# Patient Record
Sex: Female | Born: 1965 | Race: Black or African American | Hispanic: No | Marital: Single | Smoking: Never smoker
Health system: Southern US, Community
[De-identification: ages and names within clinical notes are randomized; demographics above are authoritative.]

## PROBLEM LIST (undated history)

## (undated) DIAGNOSIS — G43909 Migraine, unspecified, not intractable, without status migrainosus: Secondary | ICD-10-CM

## (undated) DIAGNOSIS — D649 Anemia, unspecified: Secondary | ICD-10-CM

## (undated) HISTORY — DX: Anemia, unspecified: D64.9

## (undated) HISTORY — DX: Migraine, unspecified, not intractable, without status migrainosus: G43.909

---

## 1997-03-27 DIAGNOSIS — G43909 Migraine, unspecified, not intractable, without status migrainosus: Secondary | ICD-10-CM

## 1997-03-27 HISTORY — DX: Migraine, unspecified, not intractable, without status migrainosus: G43.909

## 1998-07-01 ENCOUNTER — Ambulatory Visit (HOSPITAL_COMMUNITY): Admission: RE | Admit: 1998-07-01 | Discharge: 1998-07-01 | Payer: Self-pay | Admitting: Family Medicine

## 2002-02-05 ENCOUNTER — Other Ambulatory Visit: Admission: RE | Admit: 2002-02-05 | Discharge: 2002-02-05 | Payer: Self-pay | Admitting: Family Medicine

## 2002-02-05 ENCOUNTER — Encounter: Payer: Self-pay | Admitting: Family Medicine

## 2002-02-05 ENCOUNTER — Encounter: Admission: RE | Admit: 2002-02-05 | Discharge: 2002-02-05 | Payer: Self-pay | Admitting: Family Medicine

## 2002-02-13 ENCOUNTER — Ambulatory Visit (HOSPITAL_COMMUNITY): Admission: RE | Admit: 2002-02-13 | Discharge: 2002-02-13 | Payer: Self-pay | Admitting: Family Medicine

## 2002-03-25 ENCOUNTER — Ambulatory Visit (HOSPITAL_COMMUNITY): Admission: RE | Admit: 2002-03-25 | Discharge: 2002-03-25 | Payer: Self-pay | Admitting: Gastroenterology

## 2002-04-28 ENCOUNTER — Encounter: Payer: Self-pay | Admitting: Gastroenterology

## 2002-04-28 ENCOUNTER — Encounter: Admission: RE | Admit: 2002-04-28 | Discharge: 2002-04-28 | Payer: Self-pay | Admitting: Gastroenterology

## 2002-09-22 ENCOUNTER — Encounter: Admission: RE | Admit: 2002-09-22 | Discharge: 2002-09-22 | Payer: Self-pay | Admitting: Family Medicine

## 2002-09-22 ENCOUNTER — Encounter: Payer: Self-pay | Admitting: Family Medicine

## 2003-02-05 ENCOUNTER — Ambulatory Visit (HOSPITAL_COMMUNITY): Admission: RE | Admit: 2003-02-05 | Discharge: 2003-02-05 | Payer: Self-pay | Admitting: Obstetrics & Gynecology

## 2003-04-15 ENCOUNTER — Ambulatory Visit: Admission: RE | Admit: 2003-04-15 | Discharge: 2003-04-15 | Payer: Self-pay | Admitting: Gynecologic Oncology

## 2003-06-09 ENCOUNTER — Other Ambulatory Visit: Admission: RE | Admit: 2003-06-09 | Discharge: 2003-06-09 | Payer: Self-pay | Admitting: Family Medicine

## 2003-07-27 ENCOUNTER — Ambulatory Visit (HOSPITAL_COMMUNITY): Admission: RE | Admit: 2003-07-27 | Discharge: 2003-07-27 | Payer: Self-pay | Admitting: *Deleted

## 2003-11-16 ENCOUNTER — Encounter (INDEPENDENT_AMBULATORY_CARE_PROVIDER_SITE_OTHER): Payer: Self-pay | Admitting: Specialist

## 2003-11-16 ENCOUNTER — Inpatient Hospital Stay (HOSPITAL_COMMUNITY): Admission: RE | Admit: 2003-11-16 | Discharge: 2003-11-19 | Payer: Self-pay | Admitting: Obstetrics & Gynecology

## 2006-03-26 ENCOUNTER — Emergency Department (HOSPITAL_COMMUNITY): Admission: EM | Admit: 2006-03-26 | Discharge: 2006-03-26 | Payer: Self-pay | Admitting: Emergency Medicine

## 2006-04-03 ENCOUNTER — Encounter: Admission: RE | Admit: 2006-04-03 | Discharge: 2006-05-03 | Payer: Self-pay | Admitting: Family Medicine

## 2006-07-31 ENCOUNTER — Other Ambulatory Visit: Admission: RE | Admit: 2006-07-31 | Discharge: 2006-07-31 | Payer: Self-pay | Admitting: Family Medicine

## 2006-09-30 ENCOUNTER — Emergency Department (HOSPITAL_COMMUNITY): Admission: EM | Admit: 2006-09-30 | Discharge: 2006-09-30 | Payer: Self-pay | Admitting: Emergency Medicine

## 2006-10-25 ENCOUNTER — Encounter: Admission: RE | Admit: 2006-10-25 | Discharge: 2006-10-25 | Payer: Self-pay | Admitting: Family Medicine

## 2006-12-13 ENCOUNTER — Ambulatory Visit (HOSPITAL_COMMUNITY): Admission: RE | Admit: 2006-12-13 | Discharge: 2006-12-13 | Payer: Self-pay | Admitting: Obstetrics & Gynecology

## 2007-06-24 ENCOUNTER — Encounter: Admission: RE | Admit: 2007-06-24 | Discharge: 2007-06-24 | Payer: Self-pay | Admitting: Orthopedic Surgery

## 2007-12-19 ENCOUNTER — Ambulatory Visit (HOSPITAL_COMMUNITY): Admission: RE | Admit: 2007-12-19 | Discharge: 2007-12-19 | Payer: Self-pay | Admitting: Obstetrics & Gynecology

## 2008-06-27 ENCOUNTER — Emergency Department (HOSPITAL_COMMUNITY): Admission: EM | Admit: 2008-06-27 | Discharge: 2008-06-27 | Payer: Self-pay | Admitting: Family Medicine

## 2009-04-20 ENCOUNTER — Other Ambulatory Visit: Admission: RE | Admit: 2009-04-20 | Discharge: 2009-04-20 | Payer: Self-pay | Admitting: Family Medicine

## 2010-03-27 HISTORY — PX: OTHER SURGICAL HISTORY: SHX169

## 2010-04-17 ENCOUNTER — Encounter: Payer: Self-pay | Admitting: Family Medicine

## 2010-08-12 NOTE — H&P (Signed)
NAME:  Brianna Bradshaw, Brianna Bradshaw                          ACCOUNT NO.:  [id number]   MEDICAL RECORD NO.:  [id number]                   PATIENT TYPE:  AMB   LOCATION:  SDC                                  [facility]   PHYSICIAN:  Roseanna Rainbow, M.D.         DATE OF BIRTH:  05/07/1965   DATE OF ADMISSION:  DATE OF DISCHARGE:                                HISTORY & PHYSICAL   CHIEF COMPLAINT:  The patient is a 45 year old African American female with  a right-sided pelvic mass and uterine fibroids who presents for exploratory  laparotomy with right ovarian cystectomy and multiple abdominal  myomectomies.   HISTORY OF PRESENT ILLNESS:  The patient has a history of menorrhagia, mild  dysmenorrhea.  She also has history of subacute right lower quadrant  discomfort.  Work-up to date has included multiple radiologic studies.  The  radiologic studies have been consistent with a right-sided complex pelvic  mass approximately 4 cm in diameter.  A CA125 was 30.  A recent hemoglobin  was 9.7.  The patient had a second opinion given by Dr. Kyla Balzarine who agreed  with surgical intervention.   ALLERGIES:  No known drug allergies.   MEDICATIONS:  Chromagen Forte.   PAST OB/GYN HISTORY:  Please see above.   PAST MEDICAL HISTORY:  1. Anemia.  2. Headaches.   SOCIAL HISTORY:  She is single.  She denies any tobacco, ethanol or  substance abuse.   FAMILY HISTORY:  Remarkable for colon cancer, diabetes, hypertension and  lung cancer.   PHYSICAL EXAMINATION:  GENERAL APPEARANCE:  A well-developed, well-nourished  African American female in no apparent distress.  VITAL SIGNS:  Blood pressure 113/66, pulse 76, temperature 97.4, weight 140  pounds.  HEENT:  Normocephalic and atraumatic.  NECK:  Supple.  LUNGS:  Clear to auscultation bilaterally.  CARDIOVASCULAR:  Regular rate and rhythm.  ABDOMEN:  Soft, nontender, no organomegaly.  PELVIC:  BUS normal.  On speculum examination the vagina is clean.   On  bimanual examination, the uterus is upper limits of normal size, nontender.  The adnexa are nontender bilaterally.   ASSESSMENT:  1. Right-sided complex adnexal mass.  2. Myomatous uterus with secondary menorrhagia.   PLAN:  Exploratory laparotomy with possible ovarian cystectomy, possible  right salpingo-oophorectomy with multiple abdominal myomectomies.  The  risks, benefits, and alternative forms of management were reviewed with the  patient and informed consent had been obtained.                                               Roseanna Rainbow, M.D.    Judee Clara  D:  11/12/2003  T:  11/12/2003  Job:  [id number]

## 2010-08-12 NOTE — Op Note (Signed)
NAME:  Brianna Bradshaw, Brianna Bradshaw                          ACCOUNT NO.:  [id number]   MEDICAL RECORD NO.:  [id number]                   PATIENT TYPE:  INP   LOCATION:  9399                                 [facility]   PHYSICIAN:  Roseanna Rainbow, M.D.         DATE OF BIRTH:  Feb 27, 1966   DATE OF PROCEDURE:  11/16/2003  DATE OF DISCHARGE:                                 OPERATIVE REPORT   PREOPERATIVE DIAGNOSIS:  Right-sided adnexal mass, uterine fibroid, pelvic  pain.   POSTOPERATIVE DIAGNOSIS:  Right-sided adnexal mass, uterine fibroid, pelvic  pain.  Endometriosis, adhesions.   OPERATION PERFORMED:  Exploratory laparotomy with lysis of adhesions and  multiple abdominal myomectomies.   SURGEON:  1. Roseanna Rainbow, M.D.  2. Charles A. Clearance Coots, M.D.   ANESTHESIA:  General endotracheal.   ESTIMATED BLOOD LOSS:  150 mL.   COMPLICATIONS:  None.   Urine output and IV fluids as per anesthesiology.   DESCRIPTION OF PROCEDURE:  The patient was taken to the operating room.  She  was placed in the dorsal lithotomy position and prepped and draped in the  usual sterile fashion.  A Pfannenstiel skin incision was then made with a  scalpel and carried down to the underlying fascia with a Bovie.  The fascia  was nicked in the midline.  The fascial incision was then extended  bilaterally with curved Mayo scissors.  The superior aspect of the fascial  incision was then tented up with straight Kocher clamps and the underlying  rectus muscle was dissected off.  The inferior aspect of the fascial  incision was then manipulated in a similar fashion.  The rectus muscles were  separated in the midline.  The parietal peritoneum was tented up and entered  sharply with a scalpel.  This incision was then extended superiorly with  good visualization of the bladder.  An O'Sullivan-O'Connor retractor was  then placed into the incision.  The bowel was packed away with moist  laparotomy pack. At this  point a survey of the pelvic viscera included dense  adhesions involving the sigmoid colon to the posterior wall of the uterus.  A chocolate cyst in the broad ligament on the right side.  There were some  endometriotic implants on the antimesenteric portions of the tubes  bilaterally approximately 1 cm in diameter.  There were also small  endometriotic implants involving the broad ligament on the left pelvic side  wall.  The anterior cul-de-sac and appendix were noted to be free of  disease.  There were also several myomas.  One was fundal and subserosal in  the midline approximately 2 to 3 cm in diameter.  There were few bilateral  fundal myomas.  There were also several small submucous myomas.  The  endometrial cavity was entered during the course of the myomectomy.  At this  point the adhesions noted between the sigmoid and the posterior aspect of  the uterus  were sharply divided with Metzenbaum scissors.  The endometriotic  implants on the tubes were excised.  The myomas were excised using the  following technique.  The serosa over the overlying myoma was infiltrated  with a dilute Pitressin solution.  The myometrium was incised using cautery  down to the myoma. The myoma was then grasped with a towel clip and then  excised with cautery.  The base of the myoma was secured with [facility] clamps.  A suture ligature was then placed.  The defect in the myometrium which  appeared in layers using 2-0 Monocryl.  The interrupted sutures of 2-0  Monocryl.  The serosa of the uterus was then reapproximated with 3-0  Monocryl.  After all of the myoma had been removed and the defect in the  uterus repaired, an adhesive barrier was then placed over the posterior  aspect of the uterus between the uterus and the sigmoid.  The adhesive  barrier was also placed in the right broad ligament area.  The adhesive  barrier was also placed over the uterine incision.  The pelvis was copiously  irrigated.  Adequate  hemostasis was noted.  The retractors and packs were  removed from the abdomen.  The parietal peritoneum was reapproximated in  running fashion with 2-0 Vicryl.  The fascia was reapproximated with 0 PDS  in running fashion.  The skin was reapproximated with staples.  At the close  of the procedure, the instrument and pack counts were said to be correct  times two.  The patient was taken to the PACU awake and in stable condition.                                               Roseanna Rainbow, M.D.    Judee Clara  D:  11/16/2003  T:  11/16/2003  Job:  [id number]

## 2010-08-12 NOTE — Discharge Summary (Signed)
NAME:  Brianna Bradshaw, Brianna Bradshaw                          ACCOUNT NO.:  [id number]   MEDICAL RECORD NO.:  [id number]                   PATIENT TYPE:  INP   LOCATION:  9318                                 [facility]   PHYSICIAN:  Roseanna Rainbow, M.D.         DATE OF BIRTH:  1965/12/08   DATE OF ADMISSION:  11/16/2003  DATE OF DISCHARGE:                                 DISCHARGE SUMMARY   CHIEF COMPLAINT:  The patient is a 45 year old African-American female with  a right-sided pelvic mass and uterine fibroids who presents for exploratory  laparotomy with right ovarian cystectomy and multiple abdominal  myomectomies.  Please see the dictated History and Physical for further  details.   HOSPITAL COURSE:  The patient was admitted and underwent exploratory  laparotomy with lysis of adhesions and multiple abdominal myomectomies.  Please see the dictated Operative Summary for further details.  The  patient's postoperative course was remarkable for mild hypokalemia that was  corrected, and worsening of a mild to moderate preoperative anemia with a  hemoglobin of 8.2 on postoperative day #2.  She was discharged to home on  postoperative day #3 tolerating a regular diet.   DISCHARGE DIAGNOSES:  1. Endometriosis.  2. Uterine fibroids.   PROCEDURE:  Exploratory laparotomy, lysis of adhesions, multiple abdominal  myomectomies.   CONDITION:  Stable.   DIET:  Regular.   ACTIVITY:  No strenuous activity.   MEDICATIONS:  Percocet, Hemocyte, a multivitamin.   DISPOSITION:  The patient was to return to the office in 1 week.                                               Roseanna Rainbow, M.D.    Judee Clara  D:  11/19/2003  T:  11/19/2003  Job:  [id number]

## 2010-08-12 NOTE — Op Note (Signed)
   NAME:  Brianna Bradshaw, Brianna Bradshaw                          ACCOUNT NO.:  [id number]   MEDICAL RECORD NO.:  [id number]                   PATIENT TYPE:  AMB   LOCATION:  ENDO                                 FACILITY:  [facility]   PHYSICIAN:  Anselmo Rod, M.D.               DATE OF BIRTH:  [birth year]   DATE OF PROCEDURE:  03/25/2002  DATE OF DISCHARGE:                                 OPERATIVE REPORT   PROCEDURE:  Colonoscopy.   ENDOSCOPIST:  Anselmo Rod, M.D.   INSTRUMENT USED:  Olympus video colonoscope.   INDICATIONS FOR PROCEDURE:  This 45 year old African-American female with a  history of iron deficiency anemia rule out colonic polyps, masses, etc.   PREPROCEDURE PREPARATION:  Informed consent was procured from the patient.  The patient fasted for eight hours prior to the procedure and prepped with a  bottle of magnesium citrate and a bottle of NuLytely the night prior to the  procedure.   PREPROCEDURE PHYSICAL:  The patient had stable vital signs. Neck supple.  Chest clear to auscultation. S1, S2 regular. Abdomen soft with normal bowel  sounds.   DESCRIPTION OF PROCEDURE:  The patient was placed in the left lateral  decubitus position and sedated with an additional 20 mg of Demerol and 2 mg  of Versed intravenously. Once the patient was adequately sedated and  maintained on low flow oxygen and continuous cardiac monitoring, the Olympus  video colonoscope was advanced from the rectum to the cecum without  difficulty. The entire colonic mucosa appeared healthy with a normal  vascular pattern, no masses, polyps, erosions, ulcers or diverticular were  seen.   IMPRESSION:  Normal colonoscopy.    RECOMMENDATIONS:  1. A CBC will be checked serially and further recommendations to be made on     an outpatient basis.  2. Outpatient follow-up in the next two weeks.                                                 Anselmo Rod, M.D.    JNM/MEDQ  D:  03/26/2002  T:  03/26/2002   Job:  [id number]   cc:   Renaye Rakers, M.D.  [phone] [address]  [place]  [place] [address]  Fax: [phone]

## 2010-08-12 NOTE — Op Note (Signed)
   NAME:  Brianna Bradshaw, Brianna Bradshaw                          ACCOUNT NO.:  [id number]   MEDICAL RECORD NO.:  [id number]                   PATIENT TYPE:  AMB   LOCATION:  ENDO                                 FACILITY:  [facility]   PHYSICIAN:  Anselmo Rod, M.D.               DATE OF BIRTH:  [birth year]   DATE OF PROCEDURE:  03/26/2002  DATE OF DISCHARGE:                                 OPERATIVE REPORT   PROCEDURE PERFORMED:  Esophagogastroduodenoscopy.   ENDOSCOPIST:  Charna Elizabeth, M.D.   INSTRUMENT USED:  Olympus video panendoscope.   INDICATIONS FOR PROCEDURE:  Iron deficiency anemia in a 45 year old African-  American female.  Rule out peptic ulcer disease, esophagitis, gastritis,  etc.   PREPROCEDURE PREPARATION:  Informed consent was procured from the patient.  The patient was fasted for eight hours prior to the procedure.   PREPROCEDURE PHYSICAL:  The patient had stable vital signs.  Neck supple,  chest clear to auscultation.  S1, S2 regular.  Abdomen soft with normal  bowel sounds.   DESCRIPTION OF PROCEDURE:  The patient was placed in the left lateral  decubitus position and sedated with 60 mg of Demerol and 6 mg of Versed  intravenously.  Once the patient was adequately sedated and maintained on  low-flow oxygen and continuous cardiac monitoring, the Olympus video  panendoscope was advanced through the mouth piece over the tongue into the  esophagus under direct vision.  The entire esophagus appeared normal with no  evidence of ring, stricture, masses, esophagitis or Barrett's mucosa.  The  scope was then advanced to the stomach.  The entire gastric mucosa and  proximal small bowel appeared normal.  A small  hiatal hernia was seen on  high retroflexion, no other abnormalities were noted.   IMPRESSION:  Normal esophagogastroduodenoscopy except for a small  hiatal  hernia.    RECOMMENDATIONS:  Proceed with a colonoscopy at this time to further  evaluate the patient's iron  deficiency anemia.  Further recommendations made  thereafter.                                                 Anselmo Rod, M.D.    JNM/MEDQ  D:  03/26/2002  T:  03/26/2002  Job:  [id number]   cc:   Renaye Rakers, M.D.  [phone] [address]  [place]  [place] [address]  Fax: [phone]

## 2010-08-12 NOTE — Consult Note (Signed)
NAME:  Brianna Bradshaw, Brianna Bradshaw                          ACCOUNT NO.:  [id number]   MEDICAL RECORD NO.:  [id number]                   PATIENT TYPE:  OUT   LOCATION:  GYN                                  FACILITY:  [facility]   PHYSICIAN:  John T. Kyla Balzarine, M.D.                 DATE OF BIRTH:  [birth year]   DATE OF CONSULTATION:  04/15/2003  DATE OF DISCHARGE:                                   CONSULTATION   CHIEF COMPLAINT:  This 45 year old woman with leiomyomata and an adnexal  mass presents for a second opinion regarding management of her gynecologic  problems.   HISTORY OF PRESENT ILLNESS:  The patient has a history of prior low grade  lower abdominal discomfort.  The patient was evaluated initially in June and  subsequently in August with ultrasound and CT scan of the abdomen and pelvis  revealing moderately enlarged uterus with multiple likely leiomyomata  measuring up to 2.5 to 2.9 cm with overall uterine enlargement and a mass  effect measuring 2.4 x 3.9 cm adjacent to or involving the right ovary  containing both solid and cystic components with somewhat elongation about  the fluid component and a 1.8 x 1.9 cm solid portion.  On one scan this was  thought to have characteristics of a dermoid.  On followup scanning, there  was no flow within the solid area.  No free fluid was seen. The patient's  primary complaints of abdominal pain which she registered last summer have  essentially resolved. She relates very minimal dysmenorrhea.  She describes  menstrual periods as heavy, regular and with a duration of seven days.   PAST MEDICAL HISTORY:  Significant for prior anemia and stress headaches.   PAST SURGICAL HISTORY:  None.   PAST OB HISTORY:  Nulligravida.   SOCIAL HISTORY:  The patient is single, denies tobacco or ethanol use.   FAMILY HISTORY:  Father with colon cancer, diabetes and hypertension.  Mother with lung cancer and hypertension.  A sister had a benign ovarian  cyst but no  breast or ovarian cancers are known among family members.   MEDICATIONS:  None.   ALLERGIES:  None.   REVIEW OF SYMPTOMS:  The patient denies any change in general/systemic  symptoms including no anorexia or weight loss, fever or chills.  She denies  change in cardiac or pulmonary status, bowel or bladder functions.  Although  she describes her menses as heavy these have been essentially stable for  many years and she does not consider her recent menses unusually heavy.   PHYSICAL EXAMINATION:  VITAL SIGNS:  Stable and afebrile including blood  pressure 110/68, pulse 80 and respirations 18.  GENERAL:  The patient is alert and oriented x3.  HEENT:  ENT is benign with clear oropharynx.  NECK:  Supple without goiter.  There is no pathologic lymphadenopathy.  LUNGS:  Lungs fields are clear.  BACK:  There is no back or CVA tenderness.  ABDOMEN:  Scaphoid, soft, and benign without tenderness, ascites or mass.  EXTREMITIES:  Have full range of motion with no edema and full strength  throughout.  PELVIC:  External genitalia and BUS are normal to inspection and palpation.  The bladder and urethra are well supported. There are no vaginal mucosal  lesions. The cervix is small and mobile with normal consistency.  Bimanual  and rectovaginal examinations reveal normal uterus.  There is a suggestion  of fullness in the right adnexal region but no tenderness or cul-de-sac  nodularity. It should be noted on rectal examination that the posterior  uterus is irregular, compatible with leiomyomata.   LABORATORY DATA:  Scans are reviewed revealing essentially a stable adnexal  mass.  CA 125 value was obtained and minimally elevated in the 40's.   ASSESSMENT:  Likely benign adnexal mass.  Leiomyoma, essentially  symptomatic.   RECOMMENDATIONS:  While I believe that her adnexal mass is benign, I  recommended unequivocally to the patient that preferred management would be  at least a laparoscopic  unilateral salpingo-oophorectomy.  We discussed the  possibility that she could be harboring an occult ovarian malignancy, but I  believe that more likely diagnoses are dermoid cyst or perhaps  endometriosis.  I believe that if she does not wish surgery, it would be  reasonable to follow her initially at six months intervals with CA 125  values and scans, intervening only if her symptoms worsened, mass enlarged,  __________features that were worrisome for an ovarian malignancy. I answered  multiple questions and the patient will discuss this issue further with Dr.  Antionette Char.  We would be glad to assist if Dr. Christell Constant decides to take  the patient to surgery or see her back if there are any concerns regarding  followup studies.                                               John T. Kyla Balzarine, M.D.    JTS/MEDQ  D:  04/15/2003  T:  04/16/2003  Job:  [id number]   cc:   Roseanna Rainbow, M.D.  [address]  [place]  [place] [address]  Fax: [phone]   The ____________ [facility]  [address]   Telford Nab, R.N.  [phone] [address]  [place], [place] [address]

## 2010-11-24 ENCOUNTER — Other Ambulatory Visit: Payer: Self-pay | Admitting: Family Medicine

## 2010-11-24 ENCOUNTER — Ambulatory Visit
Admission: RE | Admit: 2010-11-24 | Discharge: 2010-11-24 | Disposition: A | Discharge status: Home / Self Care | Payer: BC Managed Care – PPO | Source: Ambulatory Visit | Attending: Family Medicine | Admitting: Family Medicine

## 2010-11-24 DIAGNOSIS — R05 Cough: Secondary | ICD-10-CM

## 2010-11-24 DIAGNOSIS — R059 Cough, unspecified: Secondary | ICD-10-CM

## 2011-01-02 ENCOUNTER — Other Ambulatory Visit: Payer: Self-pay | Admitting: Pulmonary Disease

## 2011-01-02 ENCOUNTER — Ambulatory Visit (HOSPITAL_COMMUNITY)
Admission: RE | Admit: 2011-01-02 | Discharge: 2011-01-02 | Disposition: A | Discharge status: Home / Self Care | Payer: BC Managed Care – PPO | Source: Ambulatory Visit | Attending: Pulmonary Disease | Admitting: Pulmonary Disease

## 2011-01-02 DIAGNOSIS — R070 Pain in throat: Secondary | ICD-10-CM | POA: Insufficient documentation

## 2011-01-02 DIAGNOSIS — R05 Cough: Secondary | ICD-10-CM | POA: Insufficient documentation

## 2011-01-02 DIAGNOSIS — R059 Cough, unspecified: Secondary | ICD-10-CM | POA: Insufficient documentation

## 2011-01-02 DIAGNOSIS — J9811 Atelectasis: Secondary | ICD-10-CM

## 2011-01-10 LAB — URINALYSIS, ROUTINE W REFLEX MICROSCOPIC
Glucose, UA: NEGATIVE
Specific Gravity, Urine: 1.012
pH: 7

## 2011-01-10 LAB — URINE CULTURE

## 2011-01-10 LAB — DIFFERENTIAL
Basophils Absolute: 0
Basophils Relative: 0
Lymphocytes Relative: 16
Lymphs Abs: 0.9
Monocytes Absolute: 0.2
Monocytes Relative: 3
Neutro Abs: 4.4
Neutrophils Relative %: 80 — ABNORMAL HIGH

## 2011-01-10 LAB — POCT PREGNANCY, URINE: Operator id: 196461

## 2011-01-10 LAB — URINE MICROSCOPIC-ADD ON

## 2011-01-10 LAB — CBC: MCHC: 32.2

## 2011-01-31 ENCOUNTER — Other Ambulatory Visit (HOSPITAL_COMMUNITY)
Admission: RE | Admit: 2011-01-31 | Discharge: 2011-01-31 | Disposition: A | Discharge status: Home / Self Care | Payer: BC Managed Care – PPO | Source: Ambulatory Visit | Attending: Family Medicine | Admitting: Family Medicine

## 2011-01-31 ENCOUNTER — Other Ambulatory Visit: Payer: Self-pay | Admitting: Family Medicine

## 2011-01-31 DIAGNOSIS — Z1159 Encounter for screening for other viral diseases: Secondary | ICD-10-CM | POA: Insufficient documentation

## 2011-01-31 DIAGNOSIS — Z01419 Encounter for gynecological examination (general) (routine) without abnormal findings: Secondary | ICD-10-CM | POA: Insufficient documentation

## 2012-02-11 ENCOUNTER — Emergency Department (HOSPITAL_COMMUNITY)
Admission: EM | Admit: 2012-02-11 | Discharge: 2012-02-11 | Disposition: A | Discharge status: Home / Self Care | Payer: BC Managed Care – PPO | Source: Home / Self Care

## 2012-02-11 ENCOUNTER — Emergency Department (INDEPENDENT_AMBULATORY_CARE_PROVIDER_SITE_OTHER): Payer: BC Managed Care – PPO

## 2012-02-11 ENCOUNTER — Encounter (HOSPITAL_COMMUNITY): Payer: Self-pay | Admitting: Emergency Medicine

## 2012-02-11 DIAGNOSIS — M25469 Effusion, unspecified knee: Secondary | ICD-10-CM

## 2012-02-11 DIAGNOSIS — M25461 Effusion, right knee: Secondary | ICD-10-CM

## 2012-02-11 MED ORDER — HYDROCODONE-ACETAMINOPHEN 5-325 MG PO TABS
ORAL_TABLET | ORAL | Status: AC
  Filled 2012-02-11: qty 2

## 2012-02-11 MED ORDER — HYDROCODONE-ACETAMINOPHEN 5-325 MG PO TABS
2.0000 | ORAL_TABLET | Freq: Once | ORAL | Status: AC
  Administered 2012-02-11: 2 via ORAL

## 2012-02-11 MED ORDER — HYDROCODONE-ACETAMINOPHEN 5-325 MG PO TABS: 2.0000 | ORAL_TABLET | ORAL | Status: AC | PRN

## 2012-02-11 NOTE — ED Provider Notes (Signed)
Medical screening examination/treatment/procedure(s) were performed by non-physician practitioner and as supervising physician I was immediately available for consultation/collaboration.  Bryli Mantey, M.D.   Caprice Mccaffrey C Cosandra Plouffe, MD 02/11/12 2140 

## 2012-02-11 NOTE — ED Provider Notes (Signed)
History     CSN: [id number]  Arrival date & time 02/11/12  1413   None     Chief Complaint  Patient presents with  . Knee Injury    twisted right knee when playing basket ball today at 1 p.m    (Consider location/radiation/quality/duration/timing/severity/associated sxs/prior treatment) Patient is a 46 y.o. female presenting with knee pain. The history is provided by the patient. No language interpreter was used.  Knee Pain This is a new problem. The problem occurs constantly. The problem has been gradually worsening. The symptoms are aggravated by bending and walking. Nothing relieves the symptoms. She has tried a cold compress for the symptoms. The treatment provided no relief.  Pt injured her knee while playing basketball  History reviewed. No pertinent past medical history.  History reviewed. No pertinent past surgical history.  History reviewed. No pertinent family history.  History  Substance Use Topics  . Smoking status: Never Smoker   . Smokeless tobacco: Not on file  . Alcohol Use: No    OB History    Grav Para Term Preterm Abortions TAB SAB Ect Mult Living                  Review of Systems  Musculoskeletal: Positive for joint swelling.  All other systems reviewed and are negative.    Allergies  Review of patient's allergies indicates no known allergies.  Home Medications  No current outpatient prescriptions on file.  BP 148/82  Pulse 85  Temp 98.6 F (37 C) (Oral)  Resp 20  SpO2 100%  LMP 12/24/2011  Physical Exam  Nursing note and vitals reviewed. Constitutional: She appears well-developed and well-nourished.  HENT:  Head: Normocephalic.  Musculoskeletal: She exhibits edema and tenderness.       Large effusion right leg,  Decreased range of motion,  No gross instabillity, nv and ns intact  Neurological: She is alert.  Skin: Skin is warm.  Psychiatric: She has a normal mood and affect.    ED Course  Procedures (including critical  care time)  Labs Reviewed - No data to display No results found.   No diagnosis found.    MDM  Knee imbolizer,  Crutches,  Hydrocodone.   Pt advised to call Dr. Lajoyce Corners to be seen next week       Lonia Skinner [place], [place] 02/11/12 1720

## 2012-02-11 NOTE — ED Notes (Signed)
Pt waiting for ride then will discharge.

## 2012-02-11 NOTE — ED Notes (Signed)
Pt states that she twisted her right knee when playing basketball today by landing on it the wrong way. Pain is felt at the top and sides of knee. Some swelling. Pt is using ice for comfort

## 2012-02-28 ENCOUNTER — Other Ambulatory Visit: Payer: Self-pay | Admitting: Family Medicine

## 2012-02-28 ENCOUNTER — Other Ambulatory Visit (HOSPITAL_COMMUNITY)
Admission: RE | Admit: 2012-02-28 | Discharge: 2012-02-28 | Disposition: A | Discharge status: Home / Self Care | Payer: BC Managed Care – PPO | Source: Ambulatory Visit | Attending: Family Medicine | Admitting: Family Medicine

## 2012-02-28 DIAGNOSIS — Z01419 Encounter for gynecological examination (general) (routine) without abnormal findings: Secondary | ICD-10-CM | POA: Insufficient documentation

## 2012-05-11 ENCOUNTER — Other Ambulatory Visit: Payer: Self-pay

## 2013-01-30 ENCOUNTER — Other Ambulatory Visit: Payer: Self-pay

## 2013-06-11 ENCOUNTER — Encounter: Payer: Self-pay | Admitting: Obstetrics & Gynecology

## 2013-06-23 ENCOUNTER — Encounter: Payer: Self-pay | Admitting: Obstetrics & Gynecology

## 2014-03-23 ENCOUNTER — Encounter: Payer: Self-pay | Admitting: *Deleted

## 2014-03-24 ENCOUNTER — Encounter: Payer: Self-pay | Admitting: Obstetrics & Gynecology

## 2015-06-25 LAB — GLUCOSE, POCT (MANUAL RESULT ENTRY): POC Glucose: 61 mg/dl — AB (ref 70–99)

## 2016-03-27 HISTORY — PX: COLONOSCOPY: SHX174

## 2016-09-21 ENCOUNTER — Encounter (INDEPENDENT_AMBULATORY_CARE_PROVIDER_SITE_OTHER): Payer: Self-pay | Admitting: Physician Assistant

## 2016-09-21 ENCOUNTER — Ambulatory Visit (INDEPENDENT_AMBULATORY_CARE_PROVIDER_SITE_OTHER): Payer: BLUE CROSS/BLUE SHIELD | Admitting: Physician Assistant

## 2016-09-21 VITALS — BP 141/80 | HR 78 | Temp 97.9°F | Ht 68.0 in | Wt 205.4 lb

## 2016-09-21 DIAGNOSIS — Z0000 Encounter for general adult medical examination without abnormal findings: Secondary | ICD-10-CM

## 2016-09-21 DIAGNOSIS — Z1231 Encounter for screening mammogram for malignant neoplasm of breast: Secondary | ICD-10-CM | POA: Diagnosis not present

## 2016-09-21 DIAGNOSIS — Z1159 Encounter for screening for other viral diseases: Secondary | ICD-10-CM

## 2016-09-21 DIAGNOSIS — R030 Elevated blood-pressure reading, without diagnosis of hypertension: Secondary | ICD-10-CM | POA: Diagnosis not present

## 2016-09-21 DIAGNOSIS — Z1239 Encounter for other screening for malignant neoplasm of breast: Secondary | ICD-10-CM

## 2016-09-21 DIAGNOSIS — Z114 Encounter for screening for human immunodeficiency virus [HIV]: Secondary | ICD-10-CM

## 2016-09-21 DIAGNOSIS — G43909 Migraine, unspecified, not intractable, without status migrainosus: Secondary | ICD-10-CM | POA: Insufficient documentation

## 2016-09-21 DIAGNOSIS — Z1211 Encounter for screening for malignant neoplasm of colon: Secondary | ICD-10-CM | POA: Diagnosis not present

## 2016-09-21 NOTE — Progress Notes (Signed)
   Subjective:  Patient ID: Brianna Bradshaw, female    DOB: [birth year]  Age: 51 y.o. MRN: [id number]  CC: annual physical  HPI Brianna Bradshaw is a 51 y.o. female with no significant PMH presents for an annual physical. Has had part of her annual health maintenance conducted at an outside provider. Does not endorse any symptoms or complaints.    Review of Systems  Constitutional: Negative for chills, fever and malaise/fatigue.  Eyes: Negative for blurred vision.  Respiratory: Negative for shortness of breath.   Cardiovascular: Negative for chest pain and palpitations.  Gastrointestinal: Negative for abdominal pain and nausea.  Genitourinary: Negative for dysuria and hematuria.  Musculoskeletal: Negative for joint pain and myalgias.  Skin: Negative for rash.  Neurological: Negative for tingling and headaches.  Psychiatric/Behavioral: Negative for depression. The patient is not nervous/anxious.     Objective:  BP (!) 141/80 (BP Location: Left Arm, Patient Position: Sitting, Cuff Size: Normal)   Pulse 78   Temp 97.9 F (36.6 C) (Oral)   Ht 5\' 8"  (1.727 m)   Wt 205 lb 6.4 oz (93.2 kg)   LMP 12/24/2011   SpO2 100%   BMI 31.23 kg/m   BP/Weight 09/21/2016 06/25/2015 02/11/2012  Systolic BP 141 127 148  Diastolic BP 80 78 82  Wt. (Lbs) 205.4 - -  BMI 31.23 - -      Physical Exam  Constitutional: She is oriented to person, place, and time.  Well developed, well nourished, NAD, very reserved and concise with answers.  HENT:  Head: Normocephalic and atraumatic.  Eyes: Conjunctivae and EOM are normal. Pupils are equal, round, and reactive to light. No scleral icterus.  Neck: Normal range of motion. Neck supple. No thyromegaly present.  Cardiovascular: Normal rate, regular rhythm and normal heart sounds.   Pulmonary/Chest: Effort normal and breath sounds normal.  Abdominal: Soft. Bowel sounds are normal. There is no tenderness.  Musculoskeletal: She exhibits no edema.  LEs, UEs,  and back with full aROM  Lymphadenopathy:    She has no cervical adenopathy.  Neurological: She is alert and oriented to person, place, and time. No cranial nerve deficit. She exhibits normal muscle tone. Coordination normal.  Strength 5/5 throughout. Patellar DTR 1+ bilaterally  Skin: Skin is warm and dry. No rash noted. No erythema. No pallor.  Psychiatric: She has a normal mood and affect. Her behavior is normal. Thought content normal.  Vitals reviewed.    Assessment & Plan:   1. Annual physical exam - Lipid panel - CBC with Differential - Comprehensive metabolic panel  2. Elevated blood pressure reading in office without diagnosis of hypertension  3. Special screening for malignant neoplasms, colon - Ambulatory referral to Gastroenterology  4. Screening for breast cancer - [facility] DIGITAL SCREENING BILATERAL; Future  5. Need for hepatitis C screening test - Hepatitis C antibody  6. Encounter for screening for HIV - HIV antibody     Follow-up: Return if symptoms worsen or fail to improve.   Loletta Specteroger David Ledarrius Beauchaine PA

## 2016-09-21 NOTE — Patient Instructions (Signed)
Dizziness Dizziness is a common problem. It is a feeling of unsteadiness or light-headedness. You may feel like you are about to faint. Dizziness can lead to injury if you stumble or fall. Anyone can become dizzy, but dizziness is more common in older adults. This condition can be caused by a number of things, including medicines, dehydration, or illness. Follow these instructions at home: Taking these steps may help with your condition: Eating and drinking  Drink enough fluid to keep your urine clear or pale yellow. This helps to keep you from becoming dehydrated. Try to drink more clear fluids, such as water.  Do not drink alcohol.  Limit your caffeine intake if directed by your health care provider.  Limit your salt intake if directed by your health care provider. Activity  Avoid making quick movements. ? Rise slowly from chairs and steady yourself until you feel okay. ? In the morning, first sit up on the side of the bed. When you feel okay, stand slowly while you hold onto something until you know that your balance is fine.  Move your legs often if you need to stand in one place for a long time. Tighten and relax your muscles in your legs while you are standing.  Do not drive or operate heavy machinery if you feel dizzy.  Avoid bending down if you feel dizzy. Place items in your home so that they are easy for you to reach without leaning over. Lifestyle  Do not use any tobacco products, including cigarettes, chewing tobacco, or electronic cigarettes. If you need help quitting, ask your health care provider.  Try to reduce your stress level, such as with yoga or meditation. Talk with your health care provider if you need help. General instructions  Watch your dizziness for any changes.  Take medicines only as directed by your health care provider. Talk with your health care provider if you think that your dizziness is caused by a medicine that you are taking.  Tell a friend or  a family member that you are feeling dizzy. If he or she notices any changes in your behavior, have this person call your health care provider.  Keep all follow-up visits as directed by your health care provider. This is important. Contact a health care provider if:  Your dizziness does not go away.  Your dizziness or light-headedness gets worse.  You feel nauseous.  You have reduced hearing.  You have new symptoms.  You are unsteady on your feet or you feel like the room is spinning. Get help right away if:  You vomit or have diarrhea and are unable to eat or drink anything.  You have problems talking, walking, swallowing, or using your arms, hands, or legs.  You feel generally weak.  You are not thinking clearly or you have trouble forming sentences. It may take a friend or family member to notice this.  You have chest pain, abdominal pain, shortness of breath, or sweating.  Your vision changes.  You notice any bleeding.  You have a headache.  You have neck pain or a stiff neck.  You have a fever. This information is not intended to replace advice given to you by your health care provider. Make sure you discuss any questions you have with your health care provider. Document Released: 09/06/2000 Document Revised: 08/19/2015 Document Reviewed: 03/09/2014 Elsevier Interactive Patient Education  2017 [employer] Colonoscopy, Adult A colonoscopy is an exam to look at the entire large intestine. During the exam, a  lubricated, bendable tube is inserted into the anus and then passed into the rectum, colon, and other parts of the large intestine. A colonoscopy is often done as a part of normal colorectal screening or in response to certain symptoms, such as anemia, persistent diarrhea, abdominal pain, and blood in the stool. The exam can help screen for and diagnose medical problems, including:  Tumors.  Polyps.  Inflammation.  Areas of bleeding.  Tell a health care  provider about:  Any allergies you have.  All medicines you are taking, including vitamins, herbs, eye drops, creams, and over-the-counter medicines.  Any problems you or family members have had with anesthetic medicines.  Any blood disorders you have.  Any surgeries you have had.  Any medical conditions you have.  Any problems you have had passing stool. What are the risks? Generally, this is a safe procedure. However, problems may occur, including:  Bleeding.  A tear in the intestine.  A reaction to medicines given during the exam.  Infection (rare).  What happens before the procedure? Eating and drinking restrictions Follow instructions from your health care provider about eating and drinking, which may include:  A few days before the procedure - follow a low-fiber diet. Avoid nuts, seeds, dried fruit, raw fruits, and vegetables.  1-3 days before the procedure - follow a clear liquid diet. Drink only clear liquids, such as clear broth or bouillon, black coffee or tea, clear juice, clear soft drinks or sports drinks, gelatin dessert, and popsicles. Avoid any liquids that contain red or purple dye.  On the day of the procedure - do not eat or drink anything during the 2 hours before the procedure, or within the time period that your health care provider recommends.  Bowel prep If you were prescribed an oral bowel prep to clean out your colon:  Take it as told by your health care provider. Starting the day before your procedure, you will need to drink a large amount of medicated liquid. The liquid will cause you to have multiple loose stools until your stool is almost clear or light Doe.  If your skin or anus gets irritated from diarrhea, you may use these to relieve the irritation: ? Medicated wipes, such as adult wet wipes with aloe and vitamin E. ? A skin soothing-product like petroleum jelly.  If you vomit while drinking the bowel prep, take a break for up to 60  minutes and then begin the bowel prep again. If vomiting continues and you cannot take the bowel prep without vomiting, call your health care provider.  General instructions  Ask your health care provider about changing or stopping your regular medicines. This is especially important if you are taking diabetes medicines or blood thinners.  Plan to have someone take you home from the hospital or clinic. What happens during the procedure?  An IV tube may be inserted into one of your veins.  You will be given medicine to help you relax (sedative).  To reduce your risk of infection: ? Your health care team will wash or sanitize their hands. ? Your anal area will be washed with soap.  You will be asked to lie on your side with your knees bent.  Your health care provider will lubricate a long, thin, flexible tube. The tube will have a camera and a light on the end.  The tube will be inserted into your anus.  The tube will be gently eased through your rectum and colon.  Air will be  delivered into your colon to keep it open. You may feel some pressure or cramping.  The camera will be used to take images during the procedure.  A small tissue sample may be removed from your body to be examined under a microscope (biopsy). If any potential problems are found, the tissue will be sent to a lab for testing.  If small polyps are found, your health care provider may remove them and have them checked for cancer cells.  The tube that was inserted into your anus will be slowly removed. The procedure may vary among health care providers and hospitals. What happens after the procedure?  Your blood pressure, heart rate, breathing rate, and blood oxygen level will be monitored until the medicines you were given have worn off.  Do not drive for 24 hours after the exam.  You may have a small amount of blood in your stool.  You may pass gas and have mild abdominal cramping or bloating due to the air  that was used to inflate your colon during the exam.  It is up to you to get the results of your procedure. Ask your health care provider, or the department performing the procedure, when your results will be ready. This information is not intended to replace advice given to you by your health care provider. Make sure you discuss any questions you have with your health care provider. Document Released: 03/10/2000 Document Revised: 01/12/2016 Document Reviewed: 05/25/2015 Elsevier Interactive Patient Education  2018 [employer].

## 2016-09-22 ENCOUNTER — Other Ambulatory Visit (INDEPENDENT_AMBULATORY_CARE_PROVIDER_SITE_OTHER): Payer: Self-pay | Admitting: Physician Assistant

## 2016-09-22 DIAGNOSIS — E785 Hyperlipidemia, unspecified: Secondary | ICD-10-CM

## 2016-09-22 LAB — COMPREHENSIVE METABOLIC PANEL
ALBUMIN: 4.4 g/dL (ref 3.5–5.5)
ALK PHOS: 123 IU/L — AB (ref 39–117)
ALT: 22 IU/L (ref 0–32)
AST: 20 IU/L (ref 0–40)
Albumin/Globulin Ratio: 1.4 (ref 1.2–2.2)
BUN / CREAT RATIO: 16 (ref 9–23)
BUN: 13 mg/dL (ref 6–24)
Bilirubin Total: 0.4 mg/dL (ref 0.0–1.2)
CO2: 20 mmol/L (ref 20–29)
CREATININE: 0.83 mg/dL (ref 0.57–1.00)
Calcium: 10 mg/dL (ref 8.7–10.2)
Chloride: 104 mmol/L (ref 96–106)
GFR calc Af Amer: 94 mL/min/{1.73_m2} (ref >59)
GFR calc non Af Amer: 81 mL/min/{1.73_m2} (ref >59)
GLUCOSE: 80 mg/dL (ref 65–99)
Globulin, Total: 3.2 g/dL (ref 1.5–4.5)
Potassium: 4 mmol/L (ref 3.5–5.2)
Sodium: 141 mmol/L (ref 134–144)
Total Protein: 7.6 g/dL (ref 6.0–8.5)

## 2016-09-22 LAB — CBC WITH DIFFERENTIAL/PLATELET
BASOS ABS: 0 10*3/uL (ref 0.0–0.2)
Basos: 0 %
EOS (ABSOLUTE): 0 10*3/uL (ref 0.0–0.4)
Eos: 1 %
HEMOGLOBIN: 14.3 g/dL (ref 11.1–15.9)
Hematocrit: 42.9 % (ref 34.0–46.6)
IMMATURE GRANS (ABS): 0 10*3/uL (ref 0.0–0.1)
Immature Granulocytes: 0 %
LYMPHS ABS: 1.8 10*3/uL (ref 0.7–3.1)
LYMPHS: 56 %
MCH: 31 pg (ref 26.6–33.0)
MCHC: 33.3 g/dL (ref 31.5–35.7)
MCV: 93 fL (ref 79–97)
MONOCYTES: 5 %
Monocytes Absolute: 0.2 10*3/uL (ref 0.1–0.9)
NEUTROS PCT: 38 %
Neutrophils Absolute: 1.3 10*3/uL — ABNORMAL LOW (ref 1.4–7.0)
Platelets: 215 10*3/uL (ref 150–379)
RBC: 4.61 x10E6/uL (ref 3.77–5.28)
RDW: 13.6 % (ref 12.3–15.4)
WBC: 3.3 10*3/uL — AB (ref 3.4–10.8)

## 2016-09-22 LAB — HEPATITIS C ANTIBODY: HEP C VIRUS AB: 0.1 {s_co_ratio} (ref 0.0–0.9)

## 2016-09-22 LAB — HIV ANTIBODY (ROUTINE TESTING W REFLEX): HIV SCREEN 4TH GENERATION: NONREACTIVE

## 2016-09-22 LAB — LIPID PANEL
CHOL/HDL RATIO: 3 ratio (ref 0.0–4.4)
Cholesterol, Total: 217 mg/dL — ABNORMAL HIGH (ref 100–199)
HDL: 73 mg/dL (ref >39)
LDL Calculated: 133 mg/dL — ABNORMAL HIGH (ref 0–99)
Triglycerides: 56 mg/dL (ref 0–149)
VLDL Cholesterol Cal: 11 mg/dL (ref 5–40)

## 2016-09-22 MED ORDER — LOVASTATIN 20 MG PO TABS: 20.0000 mg | ORAL_TABLET | Freq: Every day | ORAL | 3 refills | Status: DC

## 2016-10-11 ENCOUNTER — Encounter: Payer: Self-pay | Admitting: Internal Medicine

## 2016-10-11 ENCOUNTER — Other Ambulatory Visit (INDEPENDENT_AMBULATORY_CARE_PROVIDER_SITE_OTHER): Payer: Self-pay | Admitting: Physician Assistant

## 2016-10-11 DIAGNOSIS — Z1231 Encounter for screening mammogram for malignant neoplasm of breast: Secondary | ICD-10-CM

## 2016-10-24 ENCOUNTER — Ambulatory Visit
Admission: RE | Admit: 2016-10-24 | Discharge: 2016-10-24 | Disposition: A | Discharge status: Home / Self Care | Payer: BLUE CROSS/BLUE SHIELD | Source: Ambulatory Visit | Attending: Physician Assistant | Admitting: Physician Assistant

## 2016-10-24 DIAGNOSIS — Z1231 Encounter for screening mammogram for malignant neoplasm of breast: Secondary | ICD-10-CM

## 2016-11-30 ENCOUNTER — Ambulatory Visit (AMBULATORY_SURGERY_CENTER): Payer: Self-pay | Admitting: *Deleted

## 2016-11-30 ENCOUNTER — Encounter: Payer: Self-pay | Admitting: Internal Medicine

## 2016-11-30 VITALS — Ht 67.0 in | Wt 205.8 lb

## 2016-11-30 DIAGNOSIS — Z800 Family history of malignant neoplasm of digestive organs: Secondary | ICD-10-CM

## 2016-11-30 NOTE — Progress Notes (Signed)
No egg or soy allergy known to patient  No issues with past sedation with any surgeries  or procedures, no intubation problems  No diet pills per patient No home 02 use per patient  No blood thinners per patient  Pt denies issues with constipation  No A fib or A flutter  EMMI video sent to pt's e mail pt declined   

## 2016-12-14 ENCOUNTER — Encounter: Payer: Self-pay | Admitting: Internal Medicine

## 2016-12-14 ENCOUNTER — Ambulatory Visit (AMBULATORY_SURGERY_CENTER): Payer: BLUE CROSS/BLUE SHIELD | Admitting: Internal Medicine

## 2016-12-14 VITALS — BP 139/81 | HR 69 | Temp 98.0°F | Resp 16 | Wt 205.0 lb

## 2016-12-14 DIAGNOSIS — Z1211 Encounter for screening for malignant neoplasm of colon: Secondary | ICD-10-CM

## 2016-12-14 DIAGNOSIS — Z800 Family history of malignant neoplasm of digestive organs: Secondary | ICD-10-CM | POA: Diagnosis not present

## 2016-12-14 DIAGNOSIS — Z1212 Encounter for screening for malignant neoplasm of rectum: Secondary | ICD-10-CM | POA: Diagnosis not present

## 2016-12-14 MED ORDER — SODIUM CHLORIDE 0.9 % IV SOLN: 500.0000 mL | INTRAVENOUS | Status: DC

## 2016-12-14 NOTE — Patient Instructions (Addendum)
   I am glad to tell you that the colonoscopy is normal.  Your next routine colonoscopy should be in 5 years - 2023.  I appreciate the opportunity to care for this patient. Iva Boop, MD, FACG  YOU HAD AN ENDOSCOPIC PROCEDURE TODAY AT THE [facility]:   Refer to the procedure report that was given to you for any specific questions about what was found during the examination.  If the procedure report does not answer your questions, please call your gastroenterologist to clarify.  If you requested that your care partner not be given the details of your procedure findings, then the procedure report has been included in a sealed envelope for you to review at your convenience later.  YOU SHOULD EXPECT: Some feelings of bloating in the abdomen. Passage of more gas than usual.  Walking can help get rid of the air that was put into your GI tract during the procedure and reduce the bloating. If you had a lower endoscopy (such as a colonoscopy or flexible sigmoidoscopy) you may notice spotting of blood in your stool or on the toilet paper. If you underwent a bowel prep for your procedure, you may not have a normal bowel movement for a few days.  Please Note:  You might notice some irritation and congestion in your nose or some drainage.  This is from the oxygen used during your procedure.  There is no need for concern and it should clear up in a day or so.  SYMPTOMS TO REPORT IMMEDIATELY:   Following lower endoscopy (colonoscopy or flexible sigmoidoscopy):  Excessive amounts of blood in the stool  Significant tenderness or worsening of abdominal pains  Swelling of the abdomen that is new, acute  Fever of 100F or higher  For urgent or emergent issues, a gastroenterologist can be reached at any hour by calling [phone].   DIET:  We do recommend a small meal at first, but then you may proceed to your regular diet.  Drink plenty of fluids but you should avoid alcoholic  beverages for 24 hours.  ACTIVITY:  You should plan to take it easy for the rest of today and you should NOT DRIVE or use heavy machinery until tomorrow (because of the sedation medicines used during the test).    FOLLOW UP: Our staff will call the number listed on your records the next business day following your procedure to check on you and address any questions or concerns that you may have regarding the information given to you following your procedure. If we do not reach you, we will leave a message.  However, if you are feeling well and you are not experiencing any problems, there is no need to return our call.  We will assume that you have returned to your regular daily activities without incident.  If any biopsies were taken you will be contacted by phone or by letter within the next 1-3 weeks.  Please call us at [phone] if you have not heard about the biopsies in 3 weeks.   Repeat Colonoscopy screening in 5 years   SIGNATURES/CONFIDENTIALITY: You and/or your care partner have signed paperwork which will be entered into your electronic medical record.  These signatures attest to the fact that that the information above on your After Visit Summary has been reviewed and is understood.  Full responsibility of the confidentiality of this discharge information lies with you and/or your care-partner.

## 2016-12-14 NOTE — Op Note (Signed)
[facility] Patient Name: Brianna Bradshaw Procedure Date: 12/14/2016 9:40 AM MRN: [id number] Endoscopist: Iva Boop , MD Age: 51 Referring MD:  Date of Birth: [birth year] Gender: Female Account #: [id number] Procedure:                Colonoscopy Indications:              Screening in patient at increased risk: Colorectal                            cancer in father before age 67 Medicines:                Propofol per Anesthesia, Monitored Anesthesia Care Procedure:                Pre-Anesthesia Assessment:                           - Prior to the procedure, a History and Physical                            was performed, and patient medications and                            allergies were reviewed. The patient's tolerance of                            previous anesthesia was also reviewed. The risks                            and benefits of the procedure and the sedation                            options and risks were discussed with the patient.                            All questions were answered, and informed consent                            was obtained. Prior Anticoagulants: The patient has                            taken no previous anticoagulant or antiplatelet                            agents. ASA Grade Assessment: II - A patient with                            mild systemic disease. After reviewing the risks                            and benefits, the patient was deemed in                            satisfactory condition to undergo the procedure.  After obtaining informed consent, the colonoscope                            was passed under direct vision. Throughout the                            procedure, the patient's blood pressure, pulse, and                            oxygen saturations were monitored continuously. The                            Colonoscope was introduced through the anus and   advanced to the the cecum, identified by                            appendiceal orifice and ileocecal valve. The                            quality of the bowel preparation was excellent. The                            colonoscopy was performed without difficulty. The                            patient tolerated the procedure well. The bowel                            preparation used was Miralax. The ileocecal valve,                            appendiceal orifice, and rectum were photographed. Scope In: 9:49:13 AM Scope Out: 9:59:21 AM Scope Withdrawal Time: 0 hours 7 minutes 49 seconds  Total Procedure Duration: 0 hours 10 minutes 8 seconds  Findings:                 The perianal and digital rectal examinations were                            normal.                           The colon (entire examined portion) appeared normal.                           No additional abnormalities were found on                            retroflexion. Complications:            No immediate complications. Estimated blood loss:                            None. Estimated Blood Loss:     Estimated blood loss: none. Recommendation:           - Repeat colonoscopy in 5 years for  screening                            purposes.                           - Patient has a contact number available for                            emergencies. The signs and symptoms of potential                            delayed complications were discussed with the                            patient. Return to normal activities tomorrow.                            Written discharge instructions were provided to the                            patient.                           - Resume previous diet.                           - Continue present medications. Iva Boop, MD 12/14/2016 10:08:12 AM This report has been signed electronically.

## 2016-12-14 NOTE — Progress Notes (Signed)
Pt's states no medical or surgical changes since previsit or office visit. 

## 2016-12-14 NOTE — Progress Notes (Signed)
Report to PACU, RN, vss, BBS= Clear.  

## 2016-12-15 ENCOUNTER — Telehealth: Payer: Self-pay

## 2016-12-15 NOTE — Telephone Encounter (Signed)
  Follow up Call-  Call back number 12/14/2016  Post procedure Call Back phone  # [phone]  Permission to leave phone message Yes  Some recent data might be hidden     Patient questions:  Do you have a fever, pain , or abdominal swelling? No. Pain Score  0 *  Have you tolerated food without any problems? Yes.    Have you been able to return to your normal activities? Yes.    Do you have any questions about your discharge instructions: Diet   No. Medications  No. Follow up visit  No.  Do you have questions or concerns about your Care? No.  Actions: * If pain score is 4 or above: No action needed, pain <4.

## 2017-07-06 ENCOUNTER — Telehealth: Payer: Self-pay | Admitting: Internal Medicine

## 2017-07-06 NOTE — Telephone Encounter (Signed)
Reviewed with the insurance company codes listed in the chart.

## 2017-07-06 NOTE — Telephone Encounter (Signed)
[facility] [employer] called inquirig about coding for pt's procedure from last September 2018. She stated that the facility code was billed as diagnostic while all the others were billed as preventive so she will like to clarify that. Pls call her

## 2017-07-27 ENCOUNTER — Telehealth: Payer: Self-pay | Admitting: Internal Medicine

## 2017-07-27 NOTE — Telephone Encounter (Signed)
Pt calling inquiring about facility code of her procedure last year. She would like a call back to clarify the discrepancy.

## 2017-07-27 NOTE — Telephone Encounter (Signed)
Discussed that she was billed as a family history of colon cancer z80.0.  We reviewed with her the letter that was sent out what her responsibilities would be.  She will check back with her insurance company

## 2017-07-27 NOTE — Telephone Encounter (Signed)
Elizabeth from [employer] called looking to speak with you regarding this matter. Her phone number is [phone]. She will try to call you later.

## 2018-02-26 ENCOUNTER — Ambulatory Visit: Payer: BLUE CROSS/BLUE SHIELD | Admitting: Internal Medicine

## 2018-02-26 ENCOUNTER — Encounter: Payer: Self-pay | Admitting: Internal Medicine

## 2018-02-26 VITALS — BP 122/80 | HR 72 | Resp 12 | Ht 67.0 in | Wt 210.0 lb

## 2018-02-26 DIAGNOSIS — Z1239 Encounter for other screening for malignant neoplasm of breast: Secondary | ICD-10-CM

## 2018-02-26 DIAGNOSIS — Z7689 Persons encountering health services in other specified circumstances: Secondary | ICD-10-CM

## 2018-02-26 DIAGNOSIS — G43909 Migraine, unspecified, not intractable, without status migrainosus: Secondary | ICD-10-CM

## 2018-02-26 NOTE — Progress Notes (Signed)
Subjective:    Patient ID: Brianna CoonsAngela L Bradshaw, female    DOB: Jan 19, 1966, 52 y.o.   MRN: [id number]  HPI   Here to establish  No concerns  Migraines:  One side or the other, generally frontal.  No aura.  Has carried diagnosis since 1999.  Uses Tylenol PM for headaches.  Current Meds  Medication Sig  . diphenhydramine-acetaminophen (TYLENOL PM) 25-500 MG TABS tablet Take 1 tablet by mouth at bedtime as needed.  . Multiple Vitamin (MULTIVITAMIN) capsule Take 1 capsule by mouth daily.   No Known Allergies   Past Medical History:  Diagnosis Date  . Anemia   . Migraines 1999      Past Surgical History:  Procedure Laterality Date  . COLONOSCOPY  2018  . Exploratory Laparotomy with findings of endometriosis, lysis of adhesion, multiple myomectomies  2012    Family History  Problem Relation Age of Onset  . Colon cancer Father   . Cancer Mother        Lung:  smoker  . Hypertension Mother   . HIV/AIDS Brother   . Colon polyps Neg Hx   . Esophageal cancer Neg Hx   . Stomach cancer Neg Hx   . Rectal cancer Neg Hx     Social History   Socioeconomic History  . Marital status: Single    Spouse name: Not on file  . Number of children: Not on file  . Years of education: Not on file  . Highest education level: Bachelor's degree (e.g., BA, AB, BS)  Occupational History  . Occupation: Bookkeeping  Social Needs  . Financial resource strain: Not hard at all  . Food insecurity:    Worry: Never true    Inability: Never true  . Transportation needs:    Medical: No    Non-medical: No  Tobacco Use  . Smoking status: Never Smoker  . Smokeless tobacco: Never Used  Substance and Sexual Activity  . Alcohol use: No  . Drug use: No  . Sexual activity: Not on file  Lifestyle  . Physical activity:    Days per week: 4 days    Minutes per session: 30 min  . Stress: Not on file  Relationships  . Social connections:    Talks on phone: More than three times a week    Gets  together: More than three times a week    Attends religious service: More than 4 times per year    Active member of club or organization: Not on file    Attends meetings of clubs or organizations: Not on file    Relationship status: Never married  . Intimate partner violence:    Fear of current or ex partner: No    Emotionally abused: No    Physically abused: No    Forced sexual activity: No  Other Topics Concern  . Not on file  Social History Narrative  . Not on file        Review of Systems     Objective:   Physical Exam NAD HEENT:  PERRL, EOMI, TMs pearly gray, throat without injection.   Neck:  Supple, No adenopathy, no thyromegaly Chest:  CTA CV:  RRR with normal S1 and S2, No S3, S4, murmur or rub.  Radial and DP pulses normal and equal Abd:  S, NT, No HSM or mass, + BS LE: No edema       Assessment & Plan:  1.  Establish visit  2.  Migraines:  Uses  Tylenol PM as needed.  3.  HM:  Mammogram ordered.  Not interested in influenza vaccine at this time.  Encouraged her to reconsider.  To call when she is interested in CPE

## 2018-02-26 NOTE — Patient Instructions (Addendum)
Can google "advance directives, [place]"  And bring up form from [place]. Print and fill out Or can go to "5 wishes"  Which is also in Spanish and fill out--this costs $5--perhaps easier to use. Designate a CytogeneticistMedical Power of Attorney to speak for you if you are unable to speak for yourself when ill or injured   Call when you decide you would like to schedule a complete physical

## 2018-03-01 ENCOUNTER — Telehealth: Payer: Self-pay | Admitting: Internal Medicine

## 2018-03-01 NOTE — Telephone Encounter (Signed)
Social Work Intern (SWI) called Brianna Bradshaw and left msg welcoming her to [facility]. SWI explained the integrated healthcare mission and offered counseling services. Brianna Landngela encouraged to call back if interested.

## 2018-03-06 ENCOUNTER — Other Ambulatory Visit: Payer: Self-pay | Admitting: Internal Medicine

## 2018-03-06 DIAGNOSIS — Z1231 Encounter for screening mammogram for malignant neoplasm of breast: Secondary | ICD-10-CM

## 2018-03-19 ENCOUNTER — Ambulatory Visit
Admission: RE | Admit: 2018-03-19 | Discharge: 2018-03-19 | Disposition: A | Discharge status: Home / Self Care | Payer: BLUE CROSS/BLUE SHIELD | Source: Ambulatory Visit | Attending: Internal Medicine | Admitting: Internal Medicine

## 2018-03-19 DIAGNOSIS — Z1231 Encounter for screening mammogram for malignant neoplasm of breast: Secondary | ICD-10-CM

## 2019-07-18 ENCOUNTER — Ambulatory Visit: Payer: BLUE CROSS/BLUE SHIELD | Attending: Internal Medicine

## 2019-07-18 DIAGNOSIS — Z23 Encounter for immunization: Secondary | ICD-10-CM

## 2019-07-18 NOTE — Progress Notes (Signed)
   Covid-19 Vaccination Clinic  Name:  Brianna Bradshaw    MRN: [id number] DOB: June 28, 1965  07/18/2019  [name] was observed post Covid-19 immunization for 15 minutes without incident. She was provided with Vaccine Information Sheet and instruction to access the V-Safe system.   [name] was instructed to call 911 with any severe reactions post vaccine: Marland Kitchen Difficulty breathing  . Swelling of face and throat  . A fast heartbeat  . A bad rash all over body  . Dizziness and weakness   Immunizations Administered    Name Date Dose VIS Date Route   Pfizer COVID-19 Vaccine 07/18/2019  9:55 AM 0.3 mL 05/21/2018 Intramuscular   Manufacturer: [employer], [employer]   Lot: [id number]   NDC: [address]-2

## 2019-07-19 ENCOUNTER — Ambulatory Visit: Payer: BLUE CROSS/BLUE SHIELD

## 2019-08-01 IMAGING — MG DIGITAL SCREENING BILATERAL MAMMOGRAM WITH CAD
4 series · 4 of 4 positions shown · non-contrast
Comparison: Previous exam(s).

CLINICAL DATA: Screening.

EXAM:
DIGITAL SCREENING BILATERAL MAMMOGRAM WITH CAD

[L MLO]
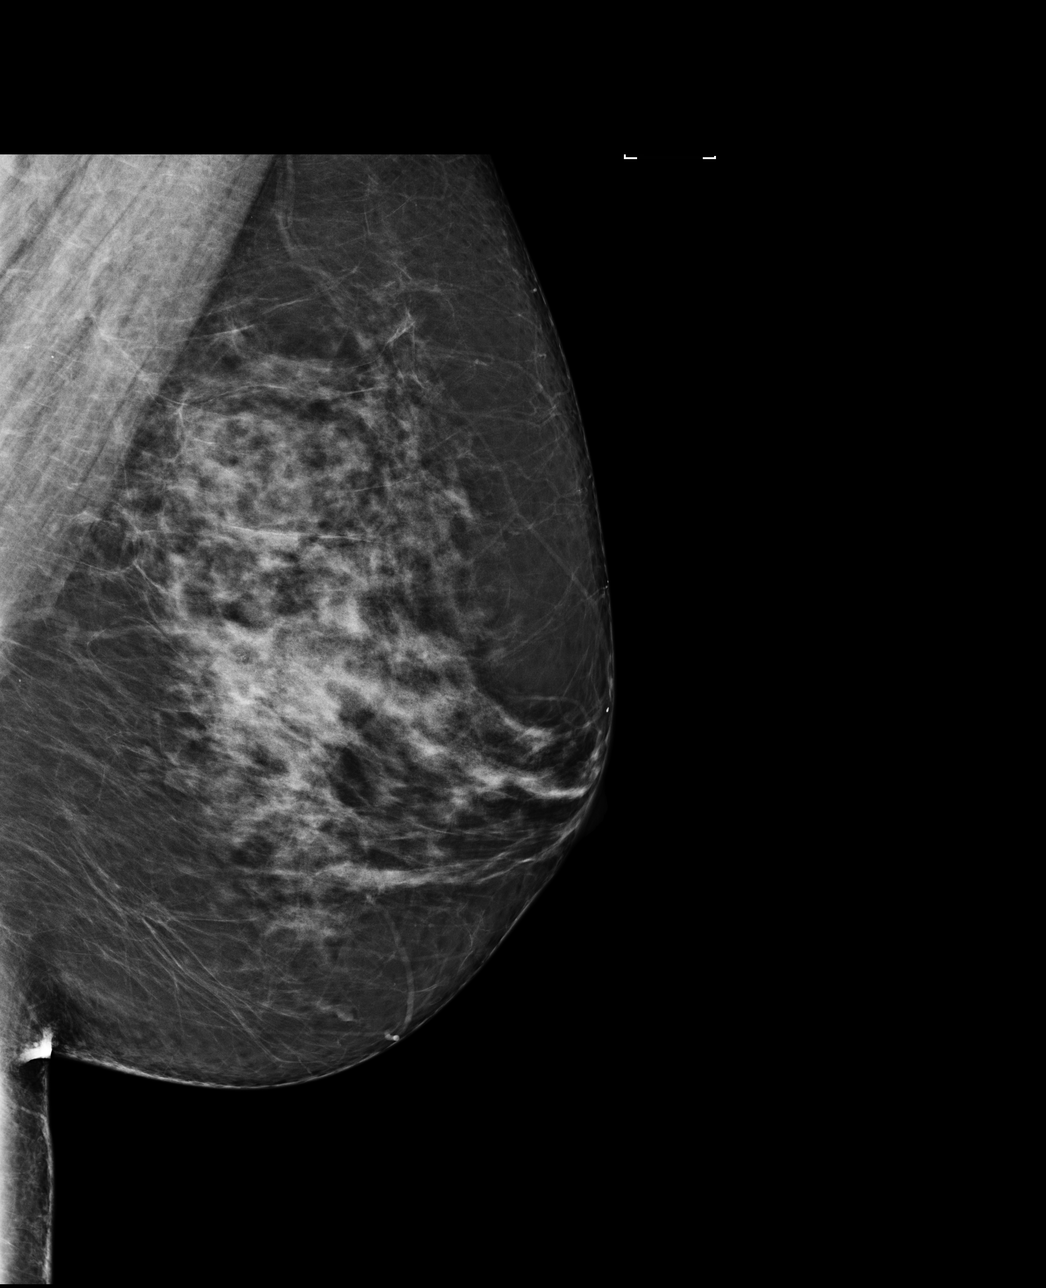

[R CC]
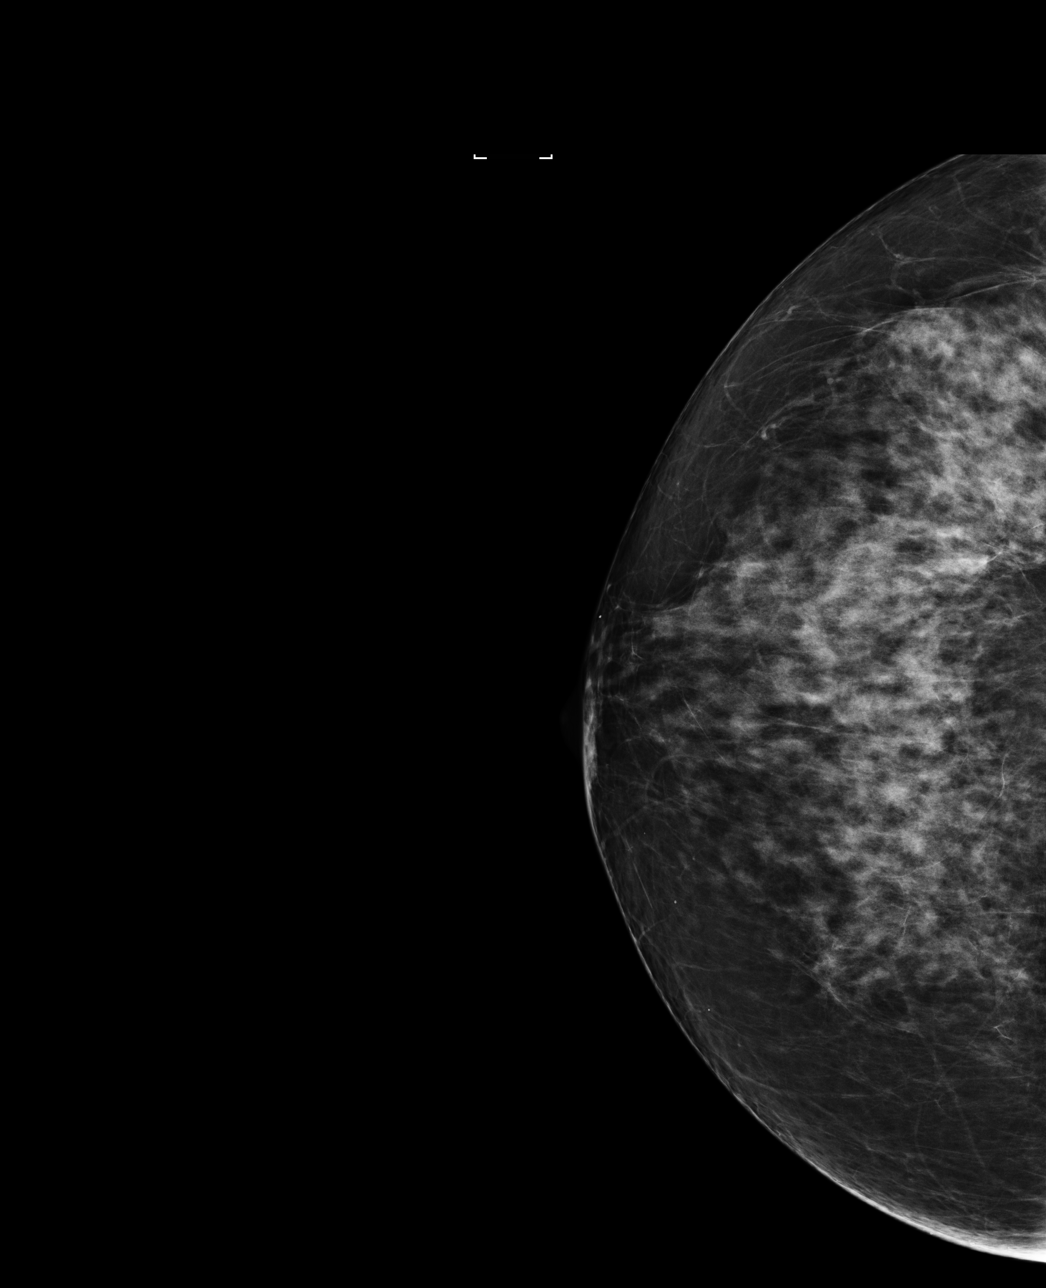

[R MLO]
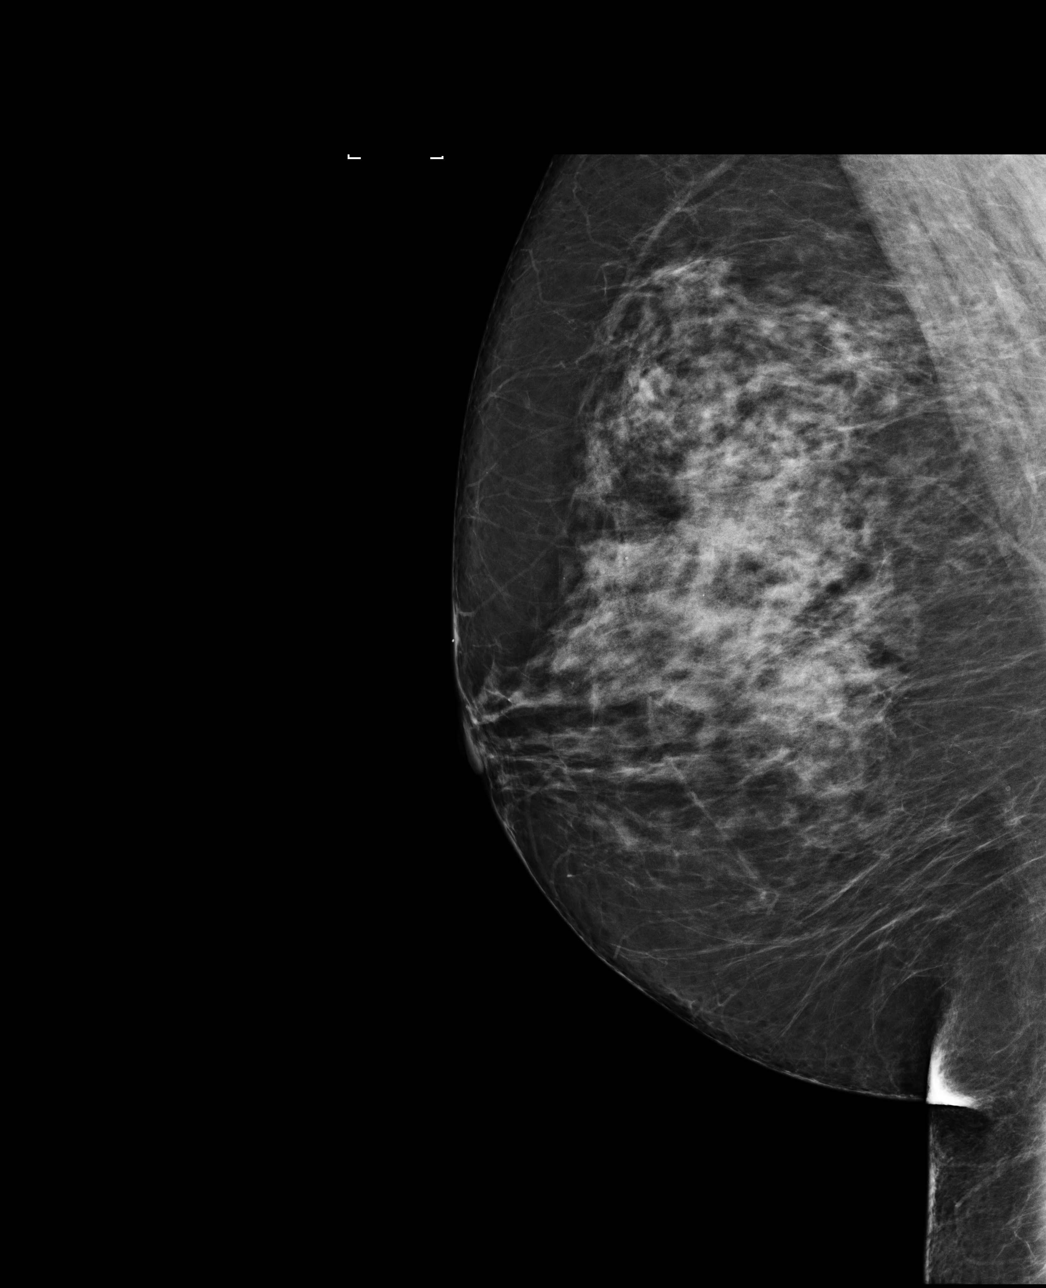

[L CC]
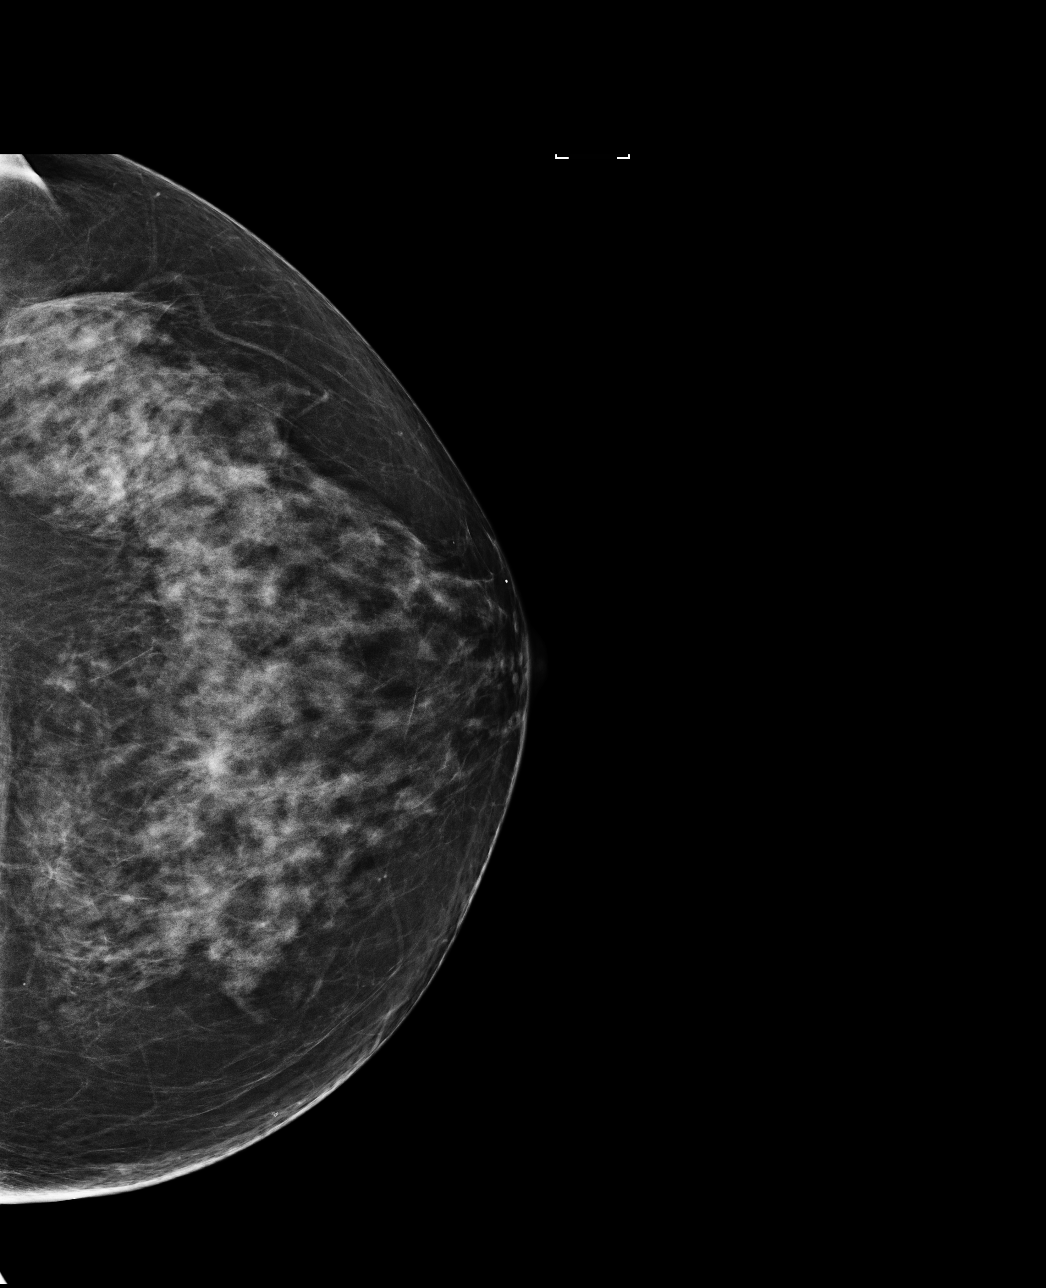

[4 of 4 positions shown; findings below may reference images not displayed]

ACR Breast Density Category c: The breast tissue is heterogeneously
dense, which may obscure small masses.
FINDINGS: There are no findings suspicious for malignancy. Images were
processed with CAD.
IMPRESSION: No mammographic evidence of malignancy. A result letter of this
screening mammogram will be mailed directly to the patient.

RECOMMENDATION:
Screening mammogram in one year. (Code:[id number])

BI-RADS CATEGORY  1: Negative.

## 2019-08-11 ENCOUNTER — Ambulatory Visit: Payer: BLUE CROSS/BLUE SHIELD | Attending: Internal Medicine

## 2019-08-11 DIAGNOSIS — Z23 Encounter for immunization: Secondary | ICD-10-CM

## 2019-08-11 NOTE — Progress Notes (Signed)
   Covid-19 Vaccination Clinic  Name:  JONIYAH MALLINGER    MRN: [id number] DOB: May 30, 1965  08/11/2019  Ms. Kertesz was observed post Covid-19 immunization for 15 minutes without incident. She was provided with Vaccine Information Sheet and instruction to access the V-Safe system.   Ms. Hieronymus was instructed to call 911 with any severe reactions post vaccine: Marland Kitchen Difficulty breathing  . Swelling of face and throat  . A fast heartbeat  . A bad rash all over body  . Dizziness and weakness   Immunizations Administered    Name Date Dose VIS Date Route   Pfizer COVID-19 Vaccine 08/11/2019  9:26 AM 0.3 mL 05/21/2018 Intramuscular   Manufacturer: [employer], [employer]   Lot: [id number]   NDC: [address]-5

## 2022-01-20 ENCOUNTER — Encounter: Payer: Self-pay | Admitting: Internal Medicine

## 2022-10-19 LAB — AMB RESULTS CONSOLE CBG: Glucose: 85

## 2022-10-19 NOTE — Progress Notes (Signed)
Pt has PCP and no SDOH needs at this time. 

## 2022-11-02 ENCOUNTER — Encounter: Payer: Self-pay | Admitting: *Deleted

## 2022-11-02 NOTE — Progress Notes (Signed)
Pt attended 10/19/22 screening event where her b/p was 147/89 and her blood sugar was 85. At the event, the pt did not note a PCP and did not identify any SDOH insecurities. Chart review indicates that pt established care with Dr. Virl Son at [facility] on 05/31/22, where her b/p was 126/80. Chart review also indicates that pt has a future appt with Dr. Leavy Cella on 06/06/23. During phone call with the pt, she did confirm Dr. Leavy Cella is her PCP and noted she had not yet let her know about her elevated b/p at the event. After discussing pt's b/p results  during the f/u phone call, pt verbalized understanding of need to f/u with her PCP and/or at least monitoring her b/p and reporting that info to her PCP if b/p after the event does not come back to "normal limits." Pt also stated she would "take care of that and get it checked again." Pt did not have any additional healthcare or SDOH needs currently. No additional health equity team support indicated at this time.

## 2023-07-04 ENCOUNTER — Other Ambulatory Visit: Payer: Self-pay | Admitting: Orthopedic Surgery

## 2023-07-20 ENCOUNTER — Other Ambulatory Visit: Payer: Self-pay

## 2023-07-20 ENCOUNTER — Encounter (HOSPITAL_BASED_OUTPATIENT_CLINIC_OR_DEPARTMENT_OTHER): Payer: Self-pay | Admitting: Orthopedic Surgery

## 2023-07-26 ENCOUNTER — Ambulatory Visit (HOSPITAL_BASED_OUTPATIENT_CLINIC_OR_DEPARTMENT_OTHER)
Admission: RE | Admit: 2023-07-26 | Discharge: 2023-07-26 | Disposition: A | Discharge status: Home / Self Care | Attending: Orthopedic Surgery | Admitting: Orthopedic Surgery

## 2023-07-26 ENCOUNTER — Other Ambulatory Visit: Payer: Self-pay

## 2023-07-26 ENCOUNTER — Ambulatory Visit (HOSPITAL_BASED_OUTPATIENT_CLINIC_OR_DEPARTMENT_OTHER): Admitting: Anesthesiology

## 2023-07-26 ENCOUNTER — Encounter (HOSPITAL_BASED_OUTPATIENT_CLINIC_OR_DEPARTMENT_OTHER)
Admission: RE | Disposition: A | Discharge status: Home / Self Care | Payer: Self-pay | Source: Home / Self Care | Attending: Orthopedic Surgery

## 2023-07-26 ENCOUNTER — Ambulatory Visit (HOSPITAL_BASED_OUTPATIENT_CLINIC_OR_DEPARTMENT_OTHER)

## 2023-07-26 ENCOUNTER — Encounter (HOSPITAL_BASED_OUTPATIENT_CLINIC_OR_DEPARTMENT_OTHER): Payer: Self-pay | Admitting: Orthopedic Surgery

## 2023-07-26 DIAGNOSIS — S6992XA Unspecified injury of left wrist, hand and finger(s), initial encounter: Secondary | ICD-10-CM | POA: Insufficient documentation

## 2023-07-26 DIAGNOSIS — M85642 Other cyst of bone, left hand: Secondary | ICD-10-CM | POA: Insufficient documentation

## 2023-07-26 DIAGNOSIS — Z6835 Body mass index (BMI) 35.0-35.9, adult: Secondary | ICD-10-CM | POA: Insufficient documentation

## 2023-07-26 DIAGNOSIS — X58XXXA Exposure to other specified factors, initial encounter: Secondary | ICD-10-CM | POA: Insufficient documentation

## 2023-07-26 DIAGNOSIS — Z01818 Encounter for other preprocedural examination: Secondary | ICD-10-CM

## 2023-07-26 DIAGNOSIS — E669 Obesity, unspecified: Secondary | ICD-10-CM | POA: Insufficient documentation

## 2023-07-26 HISTORY — PX: LIGAMENT REPAIR: SHX5444

## 2023-07-26 SURGERY — REPAIR, LIGAMENT
Anesthesia: General | Site: Index Finger | Laterality: Left

## 2023-07-26 MED ORDER — PHENYLEPHRINE 80 MCG/ML (10ML) SYRINGE FOR IV PUSH (FOR BLOOD PRESSURE SUPPORT)
PREFILLED_SYRINGE | INTRAVENOUS | Status: AC
  Filled 2023-07-26: qty 10

## 2023-07-26 MED ORDER — HYDROMORPHONE HCL 1 MG/ML IJ SOLN
INTRAMUSCULAR | Status: AC
  Filled 2023-07-26: qty 0.5

## 2023-07-26 MED ORDER — PROPOFOL 10 MG/ML IV BOLUS
INTRAVENOUS | Status: DC | PRN
  Administered 2023-07-26: 200 mg via INTRAVENOUS

## 2023-07-26 MED ORDER — MIDAZOLAM HCL 2 MG/2ML IJ SOLN
INTRAMUSCULAR | Status: AC
  Filled 2023-07-26: qty 2

## 2023-07-26 MED ORDER — LIDOCAINE 2% (20 MG/ML) 5 ML SYRINGE
INTRAMUSCULAR | Status: AC
  Filled 2023-07-26: qty 20

## 2023-07-26 MED ORDER — LACTATED RINGERS IV SOLN: INTRAVENOUS | Status: DC

## 2023-07-26 MED ORDER — KETOROLAC TROMETHAMINE 30 MG/ML IJ SOLN
INTRAMUSCULAR | Status: DC | PRN
  Administered 2023-07-26: 30 mg via INTRAVENOUS

## 2023-07-26 MED ORDER — ONDANSETRON HCL 4 MG/2ML IJ SOLN: 4.0000 mg | Freq: Once | INTRAMUSCULAR | Status: DC | PRN

## 2023-07-26 MED ORDER — CEFAZOLIN SODIUM-DEXTROSE 2-4 GM/100ML-% IV SOLN
2.0000 g | INTRAVENOUS | Status: AC
  Administered 2023-07-26: 2 g via INTRAVENOUS

## 2023-07-26 MED ORDER — PHENYLEPHRINE HCL (PRESSORS) 10 MG/ML IV SOLN
INTRAVENOUS | Status: DC | PRN
  Administered 2023-07-26 (×2): 80 ug via INTRAVENOUS

## 2023-07-26 MED ORDER — DEXAMETHASONE SODIUM PHOSPHATE 10 MG/ML IJ SOLN
INTRAMUSCULAR | Status: AC
  Filled 2023-07-26: qty 2

## 2023-07-26 MED ORDER — DEXAMETHASONE SODIUM PHOSPHATE 4 MG/ML IJ SOLN
INTRAMUSCULAR | Status: DC | PRN
  Administered 2023-07-26: 5 mg via INTRAVENOUS

## 2023-07-26 MED ORDER — FENTANYL CITRATE (PF) 100 MCG/2ML IJ SOLN
INTRAMUSCULAR | Status: DC | PRN
  Administered 2023-07-26 (×2): 50 ug via INTRAVENOUS

## 2023-07-26 MED ORDER — KETOROLAC TROMETHAMINE 30 MG/ML IJ SOLN
INTRAMUSCULAR | Status: AC
  Filled 2023-07-26: qty 2

## 2023-07-26 MED ORDER — 0.9 % SODIUM CHLORIDE (POUR BTL) OPTIME
TOPICAL | Status: DC | PRN
  Administered 2023-07-26: 100 mL

## 2023-07-26 MED ORDER — LIDOCAINE HCL (CARDIAC) PF 100 MG/5ML IV SOSY
PREFILLED_SYRINGE | INTRAVENOUS | Status: DC | PRN
  Administered 2023-07-26: 60 mg via INTRAVENOUS

## 2023-07-26 MED ORDER — FENTANYL CITRATE (PF) 100 MCG/2ML IJ SOLN
INTRAMUSCULAR | Status: AC
  Filled 2023-07-26: qty 2

## 2023-07-26 MED ORDER — MEPERIDINE HCL 25 MG/ML IJ SOLN: 6.2500 mg | INTRAMUSCULAR | Status: DC | PRN

## 2023-07-26 MED ORDER — BUPIVACAINE HCL (PF) 0.25 % IJ SOLN
INTRAMUSCULAR | Status: DC | PRN
  Administered 2023-07-26: 9 mL

## 2023-07-26 MED ORDER — OXYCODONE HCL 5 MG PO TABS
ORAL_TABLET | ORAL | Status: AC
  Filled 2023-07-26: qty 1

## 2023-07-26 MED ORDER — HYDROMORPHONE HCL 1 MG/ML IJ SOLN
0.2500 mg | INTRAMUSCULAR | Status: DC | PRN
  Administered 2023-07-26 (×4): 0.25 mg via INTRAVENOUS

## 2023-07-26 MED ORDER — HYDROCODONE-ACETAMINOPHEN 5-325 MG PO TABS: 1.0000 | ORAL_TABLET | Freq: Four times a day (QID) | ORAL | 0 refills | Status: AC | PRN

## 2023-07-26 MED ORDER — CEFAZOLIN SODIUM-DEXTROSE 2-4 GM/100ML-% IV SOLN
INTRAVENOUS | Status: AC
  Filled 2023-07-26: qty 100

## 2023-07-26 MED ORDER — ACETAMINOPHEN 500 MG PO TABS
ORAL_TABLET | ORAL | Status: AC
  Filled 2023-07-26: qty 2

## 2023-07-26 MED ORDER — OXYCODONE HCL 5 MG PO TABS
5.0000 mg | ORAL_TABLET | Freq: Once | ORAL | Status: AC | PRN
  Administered 2023-07-26: 5 mg via ORAL

## 2023-07-26 MED ORDER — OXYCODONE HCL 5 MG/5ML PO SOLN: 5.0000 mg | Freq: Once | ORAL | Status: AC | PRN

## 2023-07-26 MED ORDER — KETOROLAC TROMETHAMINE 30 MG/ML IJ SOLN: 30.0000 mg | Freq: Once | INTRAMUSCULAR | Status: DC | PRN

## 2023-07-26 MED ORDER — MIDAZOLAM HCL 2 MG/2ML IJ SOLN
INTRAMUSCULAR | Status: DC | PRN
Start: 2023-07-26 — End: 2023-07-26
  Administered 2023-07-26 (×2): 1 mg via INTRAVENOUS

## 2023-07-26 MED ORDER — AMISULPRIDE (ANTIEMETIC) 5 MG/2ML IV SOLN: 10.0000 mg | Freq: Once | INTRAVENOUS | Status: DC | PRN

## 2023-07-26 MED ORDER — ONDANSETRON HCL 4 MG/2ML IJ SOLN
INTRAMUSCULAR | Status: DC | PRN
  Administered 2023-07-26: 4 mg via INTRAVENOUS

## 2023-07-26 MED ORDER — ONDANSETRON HCL 4 MG/2ML IJ SOLN
INTRAMUSCULAR | Status: AC
  Filled 2023-07-26: qty 12

## 2023-07-26 MED ORDER — ACETAMINOPHEN 500 MG PO TABS
1000.0000 mg | ORAL_TABLET | Freq: Once | ORAL | Status: AC
  Administered 2023-07-26: 1000 mg via ORAL

## 2023-07-26 MED ORDER — EPHEDRINE 5 MG/ML INJ
INTRAVENOUS | Status: AC
  Filled 2023-07-26: qty 5

## 2023-07-26 SURGICAL SUPPLY — 60 items
BANDAGE GAUZE 1X75IN STRL (MISCELLANEOUS) IMPLANT
BENZOIN TINCTURE PRP APPL 2/3 (GAUZE/BANDAGES/DRESSINGS) IMPLANT
BLADE MINI RND TIP GREEN BEAV (BLADE) ×1 IMPLANT
BLADE SURG 15 STRL LF DISP TIS (BLADE) ×2 IMPLANT
BNDG COHESIVE 1X5 TAN STRL LF (GAUZE/BANDAGES/DRESSINGS) IMPLANT
BNDG COHESIVE 2X5 TAN ST LF (GAUZE/BANDAGES/DRESSINGS) IMPLANT
BNDG ELASTIC 2INX 5YD STR LF (GAUZE/BANDAGES/DRESSINGS) IMPLANT
BNDG ELASTIC 3INX 5YD STR LF (GAUZE/BANDAGES/DRESSINGS) IMPLANT
BNDG ESMARK 4X9 LF (GAUZE/BANDAGES/DRESSINGS) ×1 IMPLANT
BNDG GAUZE DERMACEA FLUFF 4 (GAUZE/BANDAGES/DRESSINGS) ×1 IMPLANT
BNDG PLASTER X FAST 3X3 WHT LF (CAST SUPPLIES) IMPLANT
CATH ROBINSON RED A/P 8FR (CATHETERS) IMPLANT
CHLORAPREP W/TINT 26 (MISCELLANEOUS) ×1 IMPLANT
CORD BIPOLAR FORCEPS 12FT (ELECTRODE) ×1 IMPLANT
COVER BACK TABLE 60X90IN (DRAPES) ×1 IMPLANT
COVER MAYO STAND STRL (DRAPES) ×1 IMPLANT
CUFF TOURN SGL QUICK 18X4 (TOURNIQUET CUFF) ×1 IMPLANT
DRAPE EXTREMITY T 121X128X90 (DISPOSABLE) ×1 IMPLANT
DRAPE OEC MINIVIEW 54X84 (DRAPES) IMPLANT
DRAPE SURG 17X23 STRL (DRAPES) ×1 IMPLANT
GAUZE PAD ABD 8X10 STRL (GAUZE/BANDAGES/DRESSINGS) IMPLANT
GAUZE SPONGE 4X4 12PLY STRL (GAUZE/BANDAGES/DRESSINGS) ×1 IMPLANT
GAUZE STRETCH 2X75IN STRL (MISCELLANEOUS) IMPLANT
GAUZE XEROFORM 1X8 LF (GAUZE/BANDAGES/DRESSINGS) ×1 IMPLANT
GLOVE BIO SURGEON STRL SZ7.5 (GLOVE) ×1 IMPLANT
GLOVE BIOGEL PI IND STRL 8 (GLOVE) ×1 IMPLANT
GLOVE SURG ORTHO 8.0 STRL STRW (GLOVE) IMPLANT
GOWN STRL REUS W/ TWL LRG LVL3 (GOWN DISPOSABLE) ×1 IMPLANT
GOWN STRL REUS W/TWL XL LVL3 (GOWN DISPOSABLE) ×1 IMPLANT
KWIRE DBL .035X4 NSTRL (WIRE) IMPLANT
NDL HYPO 25X1 1.5 SAFETY (NEEDLE) ×1 IMPLANT
NDL KEITH (NEEDLE) IMPLANT
NDL SAFETY ECLIPSE 18X1.5 (NEEDLE) IMPLANT
NEEDLE HYPO 25X1 1.5 SAFETY (NEEDLE) ×1 IMPLANT
NEEDLE KEITH (NEEDLE) IMPLANT
NS IRRIG 1000ML POUR BTL (IV SOLUTION) ×1 IMPLANT
PACK BASIN DAY SURGERY FS (CUSTOM PROCEDURE TRAY) ×1 IMPLANT
PAD CAST 3X4 CTTN HI CHSV (CAST SUPPLIES) ×1 IMPLANT
PAD CAST 4YDX4 CTTN HI CHSV (CAST SUPPLIES) IMPLANT
PADDING CAST ABS COTTON 4X4 ST (CAST SUPPLIES) ×1 IMPLANT
PASSER SUT SWANSON 36MM LOOP (INSTRUMENTS) IMPLANT
SLEEVE SCD COMPRESS KNEE MED (STOCKING) IMPLANT
SPIKE FLUID TRANSFER (MISCELLANEOUS) IMPLANT
SPLINT PLASTER CAST XFAST 3X15 (CAST SUPPLIES) ×1 IMPLANT
STOCKINETTE 4X48 STRL (DRAPES) ×1 IMPLANT
STRIP CLOSURE SKIN 1/2X4 (GAUZE/BANDAGES/DRESSINGS) IMPLANT
SUT CHROMIC 4 0 RB 1X27 (SUTURE) IMPLANT
SUT ETHIBOND 3-0 V-5 (SUTURE) IMPLANT
SUT ETHILON 3 0 PS 1 (SUTURE) IMPLANT
SUT ETHILON 4 0 PS 2 18 (SUTURE) ×1 IMPLANT
SUT MERSILENE 2.0 SH NDLE (SUTURE) IMPLANT
SUT MERSILENE 4 0 P 3 (SUTURE) IMPLANT
SUT SILK 4 0 PS 2 (SUTURE) IMPLANT
SUT VIC AB 4-0 PS2 18 (SUTURE) IMPLANT
SUT VICRYL 0 SH 27 (SUTURE) IMPLANT
SUTURE FIBERWR 2-0 18 17.9 3/8 (SUTURE) IMPLANT
SYR BULB EAR ULCER 3OZ GRN STR (SYRINGE) ×1 IMPLANT
SYR CONTROL 10ML LL (SYRINGE) ×1 IMPLANT
TOWEL GREEN STERILE FF (TOWEL DISPOSABLE) ×2 IMPLANT
UNDERPAD 30X36 HEAVY ABSORB (UNDERPADS AND DIAPERS) ×1 IMPLANT

## 2023-07-26 NOTE — Op Note (Signed)
 NAME: SALEEMA BRECEDA MEDICAL RECORD NO: [id number] DATE OF BIRTH: Jan 17, 1966 FACILITY: [facility] LOCATION: [facility] PHYSICIAN: Honey Zakarian R. 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   ASSISTANT: Lyanne Sample, MD   ANESTHESIA:  General   INTRAVENOUS FLUIDS:  Per anesthesia flow sheet.   ESTIMATED BLOOD LOSS:  Minimal.   COMPLICATIONS:  None.   SPECIMENS:  none   TOURNIQUET TIME:    Total Tourniquet Time Documented: Upper Arm (Left) - 29 minutes Total: Upper Arm (Left) - 29 minutes    DISPOSITION:  Stable to PACU.   INDICATIONS: 58 year old female with injury to left index finger radial collateral ligament confirmed on MRI.  This is bothersome to her.  She wishes to have surgical repair.  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  Risks, benefits and alternatives of surgery were discussed including the risks of blood loss, infection, damage to nerves, vessels, tendons, ligaments, bone for surgery, need for additional surgery, complications with wound healing, continued pain, stiffness.  She voiced understanding of these risks and elected to proceed.  OPERATIVE COURSE:  After being identified preoperatively by myself,  the patient and I agreed on the procedure and site of the procedure.  The surgical site was marked.  Surgical consent had been signed. Preoperative IV antibiotic prophylaxis was given. She was transferred to the operating room and placed on the operating table in supine position with the left upper extremity on an arm board.  General anesthesia was induced by the anesthesiologist.  Left upper extremity was prepped and draped in normal sterile orthopedic fashion.  A  surgical pause was performed between the surgeons, anesthesia, and operating room staff and all were in agreement as to the patient, procedure, and site of procedure.  Tourniquet at the proximal aspect of the extremity was inflated to 250 mmHg after exsanguination of the arm with an Esmarch bandage.  Incision was made at the dorsum of the index finger at the MP joint.  This is carried in subcutaneous tissues by spreading technique.  Bipolar electrocautery is used to obtain hemostasis.  The extensor tendon and extensor hood were identified.  The tendons were shifting ulnarly.  The radial sagittal band was sharply incised.  The capsule was incised with a knife blade.  The collateral ligament was identified.  There is a small fleck of bone attached.  The footprint at the base of the proximal phalanx was identified.  There is a small cavity which was treated with the curette.  A 0.035 inch K wire was used to create 2 bone tunnels into the footprint of the collateral ligament.  A 3-0 Ethibond suture was passed through the tunnels and used to reapproximate the collateral ligament into the footprint.  This provided good reapproximation.  An additional throw of the 3-0 Ethibond was used to help ensure that the bony fragment was by the footprint.  There was good stability of the index finger to stressing of the radial collateral ligament after repair.  The wound was copiously irrigated with sterile saline.  The capsule was repaired with a 4-0 chromic suture in a running fashion.  The ulnar sagittal bands were released approximately to allow the tendons to centralize  better.  The radial sagittal band was repaired with a running 4-0 Mersilene suture.  Skin was closed with 4-0 nylon in a horizontal mattress fashion.  The wound was injected with quarter percent plain Marcaine  to aid in postoperative analgesia.  It was then dressed with sterile Xeroform 4 x 4's and wrapped with a Kerlix bandage.  A volar splint was placed and  wrapped with Kerlix and Ace bandage.  The tourniquet was deflated at 29 minutes.  Fingertips were pink with brisk capillary refill after deflation of tourniquet.  The operative  drapes were broken down.  The patient was awoken from anesthesia safely.  She was transferred back to the stretcher and taken to PACU in stable condition.  I will see her back in the office in 1 week for postoperative followup.  I will give her a prescription for Norco 5/325 1 tab PO q6 hours prn pain, dispense # 15.   Nyheim Seufert, MD Electronically signed, 07/26/23

## 2023-07-26 NOTE — Anesthesia Postprocedure Evaluation (Signed)
 Anesthesia Post Note  Patient: Brianna Bradshaw  Procedure(s) Performed: REPAIR, LIGAMENT (Left: Index Finger) EXCISION METACARPAL MASS (Left: Index Finger)     Patient location during evaluation: PACU Anesthesia Type: General Level of consciousness: awake and alert, oriented and patient cooperative Pain management: pain level controlled Vital Signs Assessment: post-procedure vital signs reviewed and stable Respiratory status: spontaneous breathing, nonlabored ventilation and respiratory function stable Cardiovascular status: blood pressure returned to baseline and stable Postop Assessment: no apparent nausea or vomiting Anesthetic complications: no   No notable events documented.  Last Vitals:  Vitals:   07/26/23 1354 07/26/23 1400  BP:  (!) 149/101  Pulse:  80  Resp:  16  Temp:    SpO2: 98% 97%    Last Pain:  Vitals:   07/26/23 1406  TempSrc:   PainSc: Asleep                 Jacquelyne Matte

## 2023-07-26 NOTE — H&P (Signed)
 Brianna Bradshaw is an 58 y.o. female.   Chief Complaint: collateral ligament injury  HPI: 58 yo female with injury to left index finger.  MRI confirms radial collateral ligament injury and bone cyst.  It is bothersome to her.  She wishes to proceed with radial collateral ligament repair and curettage of bone cyst.  Allergies: No Known Allergies  Past Medical History:  Diagnosis Date   Anemia    resolved   Migraines 1999    Past Surgical History:  Procedure Laterality Date   COLONOSCOPY  2018   Exploratory Laparotomy with findings of endometriosis, lysis of adhesion, multiple myomectomies  2012    Family History: Family History  Problem Relation Age of Onset   Colon cancer Father    Cancer Mother        Lung:  smoker   Hypertension Mother    HIV/AIDS Brother    Colon polyps Neg Hx    Esophageal cancer Neg Hx    Stomach cancer Neg Hx    Rectal cancer Neg Hx    Breast cancer Neg Hx     Social History:   reports that she has never smoked. She has never used smokeless tobacco. She reports that she does not drink alcohol and does not use drugs.  Medications: Medications Prior to Admission  Medication Sig Dispense Refill   diphenhydramine-acetaminophen  (TYLENOL  PM) 25-500 MG TABS tablet Take 1 tablet by mouth at bedtime as needed.     ibuprofen (ADVIL) 200 MG tablet Take 200 mg by mouth every 6 (six) hours as needed.     Multiple Vitamin (MULTIVITAMIN) capsule Take 1 capsule by mouth daily.      No results found for this or any previous visit (from the past 48 hours).  No results found.    Blood pressure 137/85, pulse 75, temperature (!) 97.5 F (36.4 C), temperature source Temporal, resp. rate 18, height 5\' 7"  (1.702 m), weight 101.1 kg, last menstrual period 12/23/2012, SpO2 100%.  General appearance: alert, cooperative, and appears stated age Head: Normocephalic, without obvious abnormality, atraumatic Neck: supple, symmetrical, trachea midline Extremities: Intact  sensation and capillary refill all digits.  +epl/fpl/io.  No wounds.  Skin: Skin color, texture, turgor normal. No rashes or lesions Neurologic: Grossly normal Incision/Wound: none  Assessment/Plan Left index finger radial collateral ligament injury and proximal phalanx bone cyst.  Non operative and operative treatment options have been discussed with the patient and patient wishes to proceed with operative treatment. Risks, benefits, and alternatives of surgery have been discussed and the patient agrees with the plan of care.   Brianna Bradshaw 07/26/2023, 11:21 AM

## 2023-07-26 NOTE — Transfer of Care (Signed)
 Immediate Anesthesia Transfer of Care Note  Patient: Brianna Bradshaw  Procedure(s) Performed: REPAIR, LIGAMENT (Left: Index Finger) EXCISION METACARPAL MASS (Left: Index Finger)  Patient Location: PACU  Anesthesia Type:General  Level of Consciousness: awake, alert , oriented, and patient cooperative  Airway & Oxygen Therapy: Patient Spontanous Breathing and Patient connected to nasal cannula oxygen  Post-op Assessment: Report given to RN and Post -op Vital signs reviewed and stable  Post vital signs: Reviewed and stable  Last Vitals:  Vitals Value Taken Time  BP 149/87 07/26/23 1345  Temp    Pulse 91 07/26/23 1348  Resp 17 07/26/23 1348  SpO2 99 % 07/26/23 1348  Vitals shown include unfiled device data.  Last Pain:  Vitals:   07/26/23 1031  TempSrc: Temporal  PainSc: 0-No pain      Patients Stated Pain Goal: 2 (07/26/23 1031)  Complications: No notable events documented.

## 2023-07-26 NOTE — Discharge Instructions (Addendum)
 [facility] Instructions Hand Surgery  Wound Care: Keep your hand elevated above the level of your heart.  Do not allow it to dangle by your side.  Keep the dressing dry and do not remove it unless your doctor advises you to do so.  He will usually change it at the time of your post-op visit.  Moving your fingers is advised to stimulate circulation but will depend on the site of your surgery.  If you have a splint applied, your doctor will advise you regarding movement.  Activity: Do not drive or operate machinery today.  Rest today and then you may return to your normal activity and work as indicated by your physician.  Diet:  Drink liquids today or eat a light diet.  You may resume a regular diet tomorrow.    General expectations: Pain for two to three days. Fingers may become slightly swollen.  Call your doctor if any of the following occur: Severe pain not relieved by pain medication. Elevated temperature. Dressing soaked with blood. Inability to move fingers. White or bluish color to fingers.   May take Tylenol  after 4:30 pm, if needed.  May take NSAIDS (ibuprofen/motrin) after 7:45 pm, if needed.    Post Anesthesia Home Care Instructions  Activity: Get plenty of rest for the remainder of the day. A responsible individual must stay with you for 24 hours following the procedure.  For the next 24 hours, DO NOT: -Drive a car -Advertising copywriter -Drink alcoholic beverages -Take any medication unless instructed by your physician -Make any legal decisions or sign important papers.  Meals: Start with liquid foods such as gelatin or soup. Progress to regular foods as tolerated. Avoid greasy, spicy, heavy foods. If nausea and/or vomiting occur, drink only clear liquids until the nausea and/or vomiting subsides. Call your physician if vomiting continues.  Special Instructions/Symptoms: Your throat may feel dry or sore from the anesthesia or the breathing tube placed in your throat  during surgery. If this causes discomfort, gargle with warm salt water. The discomfort should disappear within 24 hours.  If you had a scopolamine patch placed behind your ear for the management of post- operative nausea and/or vomiting:  1. The medication in the patch is effective for 72 hours, after which it should be removed.  Wrap patch in a tissue and discard in the trash. Wash hands thoroughly with soap and water. 2. You may remove the patch earlier than 72 hours if you experience unpleasant side effects which may include dry mouth, dizziness or visual disturbances. 3. Avoid touching the patch. Wash your hands with soap and water after contact with the patch.

## 2023-07-26 NOTE — Anesthesia Preprocedure Evaluation (Addendum)
 Anesthesia Evaluation  Patient identified by MRN, date of birth, ID band Patient awake    Reviewed: Allergy & Precautions, NPO status , Patient's Chart, lab work & pertinent test results  Airway Mallampati: III  TM Distance: >3 FB Neck ROM: Full    Dental no notable dental hx. (+) Teeth Intact, Dental Advisory Given   Pulmonary neg pulmonary ROS   Pulmonary exam normal breath sounds clear to auscultation       Cardiovascular negative cardio ROS Normal cardiovascular exam Rhythm:Regular Rate:Normal     Neuro/Psych  Headaches  negative psych ROS   GI/Hepatic negative GI ROS, Neg liver ROS,,,  Endo/Other  Obesity BMI 35  Renal/GU negative Renal ROS  negative genitourinary   Musculoskeletal negative musculoskeletal ROS (+)    Abdominal  (+) + obese  Peds  Hematology negative hematology ROS (+)   Anesthesia Other Findings   Reproductive/Obstetrics negative OB ROS                             Anesthesia Physical Anesthesia Plan  ASA: 2  Anesthesia Plan: General   Post-op Pain Management: Tylenol  PO (pre-op)* and Toradol  IV (intra-op)*   Induction: Intravenous  PONV Risk Score and Plan: 3 and Ondansetron , Dexamethasone , Treatment may vary due to age or medical condition and Midazolam   Airway Management Planned: LMA  Additional Equipment: None  Intra-op Plan:   Post-operative Plan: Extubation in OR  Informed Consent: I have reviewed the patients History and Physical, chart, labs and discussed the procedure including the risks, benefits and alternatives for the proposed anesthesia with the patient or authorized representative who has indicated his/her understanding and acceptance.     Dental advisory given  Plan Discussed with: CRNA  Anesthesia Plan Comments:        Anesthesia Quick Evaluation

## 2023-07-26 NOTE — Op Note (Signed)
 I assisted Surgeons and Role:    * Brunilda Capra, MD - Primary    Lyanne Sample, MD - Assisting on the Procedure(s): REPAIR, LIGAMENT EXCISION METACARPAL MASS on 07/26/2023.  I provided assistance on this case as follows: Set up, approach, traction for arthrotomy, identification of the a avulsed collateral ligament, preparation of the bed for reattachment, placement of drill holes in the proximal phalanx, repair of the collateral ligament through drill holes, Lausier of the joint wound and application of the dressing and splint.   Electronically signed by: Lyanne Sample, MD Date: 07/26/2023 Time: 1:35 PM

## 2023-07-26 NOTE — Anesthesia Procedure Notes (Signed)
 Procedure Name: LMA Insertion Date/Time: 07/26/2023 12:44 PM  Performed by: Lonia Ro, CRNAPre-anesthesia Checklist: Patient identified, Patient being monitored, Emergency Drugs available, Timeout performed and Suction available Patient Re-evaluated:Patient Re-evaluated prior to induction Oxygen Delivery Method: Circle System Utilized Preoxygenation: Pre-oxygenation with 100% oxygen Induction Type: IV induction Ventilation: Mask ventilation without difficulty LMA: LMA inserted LMA Size: 4.0 Number of attempts: 1 Placement Confirmation: positive ETCO2, breath sounds checked- equal and bilateral and CO2 detector

## 2023-07-27 ENCOUNTER — Encounter (HOSPITAL_BASED_OUTPATIENT_CLINIC_OR_DEPARTMENT_OTHER): Payer: Self-pay | Admitting: Orthopedic Surgery

## 2023-11-03 LAB — AMB RESULTS CONSOLE CBG: Glucose: 97

## 2023-11-03 NOTE — Progress Notes (Signed)
No SDOH needs

## 2024-01-11 NOTE — Progress Notes (Deleted)
 The patient attended a screening event on _______, where her blood pressure was measured at ______ mmHg, and her A1C was _____%, placing her in the diabetic range. During the event, the patient reported that he/she does/does not smoke, has insurance, is/is'n't established with a primary care provider (PCP), and does/does not have identified social determinants of health (SDOH) concerns. A chart review confirmed that the patient has/does not have an active PCP.  The patient's most recent office visit was on ______, where she was referred by her PCP for _____.  ________ is appropriately listed in her medical record, and a follow-up appointment with her PCP is scheduled for _______.  The patient demonstrates consistent engagement in her care. At this time, no additional support from the Health Equity Team is indicated./An additional follow up will be done at a later date per the Health Equity Teams protocol.

## 2024-01-11 NOTE — Progress Notes (Signed)
 The patient attended a screening event on 11/03/2023, where her blood pressure was measured at 133/85 mmHg, and her non-fasting blood glucose is 97 mg/dl. During the event, the patient reported that she does not smoke, she did not indicate her insurance, is established with a primary care provider [place] Brought?), and no SDOH needs indicated at this time.  A chart review indicates Dr. Madelin Brought with [facility]  as her PCP. The patient's most recent office visit was on 06/06/2023. She has an upcoming appt on 06/11/2024 at 9:00 AM. Her insurance is [employer] and she has no SDOH needs indicated at this time.  [facility] sent courtesy letter with bp education and healthy cooking class in case needed by pt.  At this time, no additional support from the Health Equity Team is indicated.
